# Patient Record
Sex: Male | Born: 1985 | Race: White | Hispanic: No | Marital: Single | Smoking: Former smoker
Health system: Southern US, Community
[De-identification: ages and names within clinical notes are randomized; demographics above are authoritative.]

## PROBLEM LIST (undated history)

## (undated) DIAGNOSIS — R569 Unspecified convulsions: Secondary | ICD-10-CM

## (undated) DIAGNOSIS — F419 Anxiety disorder, unspecified: Secondary | ICD-10-CM

## (undated) DIAGNOSIS — K219 Gastro-esophageal reflux disease without esophagitis: Secondary | ICD-10-CM

## (undated) DIAGNOSIS — F1910 Other psychoactive substance abuse, uncomplicated: Secondary | ICD-10-CM

## (undated) HISTORY — DX: Other psychoactive substance abuse, uncomplicated: F19.10

## (undated) HISTORY — DX: Anxiety disorder, unspecified: F41.9

## (undated) HISTORY — DX: Unspecified convulsions: R56.9

---

## 2007-12-07 ENCOUNTER — Emergency Department (HOSPITAL_COMMUNITY): Admission: EM | Admit: 2007-12-07 | Discharge: 2007-12-07 | Payer: Self-pay | Admitting: Emergency Medicine

## 2007-12-18 ENCOUNTER — Emergency Department: Payer: Self-pay | Admitting: Emergency Medicine

## 2008-05-26 ENCOUNTER — Emergency Department: Payer: Self-pay | Admitting: Emergency Medicine
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
## 2013-09-12 ENCOUNTER — Ambulatory Visit: Payer: Self-pay | Admitting: Family Medicine

## 2015-01-24 ENCOUNTER — Encounter: Payer: Self-pay | Admitting: Emergency Medicine

## 2015-01-24 ENCOUNTER — Emergency Department
Admission: EM | Admit: 2015-01-24 | Discharge: 2015-01-24 | Disposition: A | Discharge status: Home / Self Care | Payer: Self-pay | Attending: Emergency Medicine | Admitting: Emergency Medicine

## 2015-01-24 ENCOUNTER — Emergency Department: Payer: Self-pay

## 2015-01-24 DIAGNOSIS — Y9289 Other specified places as the place of occurrence of the external cause: Secondary | ICD-10-CM | POA: Insufficient documentation

## 2015-01-24 DIAGNOSIS — S61411A Laceration without foreign body of right hand, initial encounter: Secondary | ICD-10-CM | POA: Insufficient documentation

## 2015-01-24 DIAGNOSIS — Y998 Other external cause status: Secondary | ICD-10-CM | POA: Insufficient documentation

## 2015-01-24 DIAGNOSIS — Z23 Encounter for immunization: Secondary | ICD-10-CM | POA: Insufficient documentation

## 2015-01-24 DIAGNOSIS — S61431A Puncture wound without foreign body of right hand, initial encounter: Secondary | ICD-10-CM | POA: Insufficient documentation

## 2015-01-24 DIAGNOSIS — Z720 Tobacco use: Secondary | ICD-10-CM | POA: Insufficient documentation

## 2015-01-24 DIAGNOSIS — Y288XXA Contact with other sharp object, undetermined intent, initial encounter: Secondary | ICD-10-CM | POA: Insufficient documentation

## 2015-01-24 DIAGNOSIS — Y9389 Activity, other specified: Secondary | ICD-10-CM | POA: Insufficient documentation

## 2015-01-24 MED ORDER — CEPHALEXIN 500 MG PO CAPS: 500.0000 mg | ORAL_CAPSULE | Freq: Two times a day (BID) | ORAL | Status: AC

## 2015-01-24 MED ORDER — NAPROXEN 500 MG PO TABS: 500.0000 mg | ORAL_TABLET | Freq: Two times a day (BID) | ORAL | Status: AC

## 2015-01-24 MED ORDER — TETANUS-DIPHTH-ACELL PERTUSSIS 5-2.5-18.5 LF-MCG/0.5 IM SUSP
0.5000 mL | Freq: Once | INTRAMUSCULAR | Status: AC
  Administered 2015-01-24: 0.5 mL via INTRAMUSCULAR
  Filled 2015-01-24: qty 0.5
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
## 2015-01-24 NOTE — Discharge Instructions (Signed)
Return to the emergency department for symptoms that change or worsen after unable to schedule an appointment with primary care.

## 2015-04-20 ENCOUNTER — Encounter: Payer: Self-pay | Admitting: Emergency Medicine

## 2015-04-20 ENCOUNTER — Emergency Department
Admission: EM | Admit: 2015-04-20 | Discharge: 2015-04-20 | Disposition: A | Discharge status: Home / Self Care | Payer: Self-pay | Attending: Emergency Medicine | Admitting: Emergency Medicine

## 2015-04-20 DIAGNOSIS — Y9289 Other specified places as the place of occurrence of the external cause: Secondary | ICD-10-CM | POA: Insufficient documentation

## 2015-04-20 DIAGNOSIS — Z791 Long term (current) use of non-steroidal anti-inflammatories (NSAID): Secondary | ICD-10-CM | POA: Insufficient documentation

## 2015-04-20 DIAGNOSIS — Y9389 Activity, other specified: Secondary | ICD-10-CM | POA: Insufficient documentation

## 2015-04-20 DIAGNOSIS — Y998 Other external cause status: Secondary | ICD-10-CM | POA: Insufficient documentation

## 2015-04-20 DIAGNOSIS — Z720 Tobacco use: Secondary | ICD-10-CM | POA: Insufficient documentation

## 2015-04-20 DIAGNOSIS — W228XXA Striking against or struck by other objects, initial encounter: Secondary | ICD-10-CM | POA: Insufficient documentation

## 2015-04-20 DIAGNOSIS — S0502XA Injury of conjunctiva and corneal abrasion without foreign body, left eye, initial encounter: Secondary | ICD-10-CM | POA: Insufficient documentation

## 2015-04-20 MED ORDER — CIPROFLOXACIN HCL 0.3 % OP SOLN
2.0000 [drp] | OPHTHALMIC | Status: DC
  Administered 2015-04-20: 2 [drp] via OPHTHALMIC

## 2015-04-20 MED ORDER — TETRACAINE HCL 0.5 % OP SOLN
2.0000 [drp] | Freq: Once | OPHTHALMIC | Status: AC
  Administered 2015-04-20: 2 [drp] via OPHTHALMIC

## 2015-04-20 MED ORDER — FLUORESCEIN SODIUM 1 MG OP STRP
1.0000 | ORAL_STRIP | Freq: Once | OPHTHALMIC | Status: AC
  Administered 2015-04-20: 1 via OPHTHALMIC

## 2015-04-20 MED ORDER — CIPROFLOXACIN HCL 0.3 % OP SOLN
OPHTHALMIC | Status: AC
Start: 2015-04-20 — End: 2015-04-20
  Administered 2015-04-20: 2 [drp] via OPHTHALMIC
  Filled 2015-04-20: qty 2.5

## 2015-04-20 MED ORDER — TETRACAINE HCL 0.5 % OP SOLN
OPHTHALMIC | Status: AC
Start: 2015-04-20 — End: 2015-04-20
  Administered 2015-04-20: 2 [drp] via OPHTHALMIC
  Filled 2015-04-20: qty 2

## 2015-04-20 MED ORDER — CIPROFLOXACIN HCL 0.3 % OP SOLN: 2.0000 [drp] | OPHTHALMIC | Status: AC

## 2015-04-20 MED ORDER — KETOROLAC TROMETHAMINE 0.5 % OP SOLN: 1.0000 [drp] | Freq: Four times a day (QID) | OPHTHALMIC | Status: DC

## 2015-04-20 MED ORDER — FLUORESCEIN SODIUM 1 MG OP STRP
ORAL_STRIP | OPHTHALMIC | Status: AC
  Administered 2015-04-20: 1 via OPHTHALMIC
  Filled 2015-04-20: qty 1

## 2015-04-20 MED ORDER — ACETAMINOPHEN-CODEINE #3 300-30 MG PO TABS: 1.0000 | ORAL_TABLET | ORAL | Status: DC | PRN

## 2015-04-20 NOTE — Discharge Instructions (Signed)

## 2015-04-20 NOTE — ED Notes (Signed)
Discussed discharge instructions, prescriptions, and follow-up care with patient. No questions or concerns at this time. Pt stable at discharge.  
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
## 2016-08-29 ENCOUNTER — Encounter (HOSPITAL_COMMUNITY): Payer: Self-pay

## 2016-08-29 ENCOUNTER — Emergency Department (HOSPITAL_COMMUNITY)
Admission: EM | Admit: 2016-08-29 | Discharge: 2016-08-30 | Disposition: A | Discharge status: Home / Self Care | Payer: Self-pay | Attending: Emergency Medicine | Admitting: Emergency Medicine

## 2016-08-29 ENCOUNTER — Emergency Department (HOSPITAL_COMMUNITY): Payer: Self-pay

## 2016-08-29 DIAGNOSIS — F17200 Nicotine dependence, unspecified, uncomplicated: Secondary | ICD-10-CM | POA: Insufficient documentation

## 2016-08-29 DIAGNOSIS — R0789 Other chest pain: Secondary | ICD-10-CM | POA: Insufficient documentation

## 2016-08-29 LAB — BASIC METABOLIC PANEL
ANION GAP: 9 (ref 5–15)
BUN: 19 mg/dL (ref 6–20)
CALCIUM: 9 mg/dL (ref 8.9–10.3)
CO2: 27 mmol/L (ref 22–32)
Chloride: 99 mmol/L — ABNORMAL LOW (ref 101–111)
Creatinine, Ser: 0.96 mg/dL (ref 0.61–1.24)
Glucose, Bld: 74 mg/dL (ref 65–99)
Potassium: 3.2 mmol/L — ABNORMAL LOW (ref 3.5–5.1)
Sodium: 135 mmol/L (ref 135–145)

## 2016-08-29 LAB — CBC
HCT: 42.2 % (ref 39.0–52.0)
HEMOGLOBIN: 14.6 g/dL (ref 13.0–17.0)
MCH: 31.1 pg (ref 26.0–34.0)
MCHC: 34.6 g/dL (ref 30.0–36.0)
MCV: 89.8 fL (ref 78.0–100.0)
Platelets: 232 10*3/uL (ref 150–400)
RBC: 4.7 MIL/uL (ref 4.22–5.81)
RDW: 13.2 % (ref 11.5–15.5)
WBC: 8.3 10*3/uL (ref 4.0–10.5)

## 2016-08-29 LAB — I-STAT TROPONIN, ED: TROPONIN I, POC: 0 ng/mL (ref 0.00–0.08)

## 2016-08-29 NOTE — ED Notes (Signed)
Patient transported to X-ray 

## 2016-08-29 NOTE — ED Triage Notes (Signed)
Pt states that for the past several weeks he has been having CP on and off, L sided, with SOB, denies n/v, denies radiation.

## 2016-08-29 NOTE — ED Provider Notes (Signed)
[facility]-EMERGENCY DEPT Provider Note   CSN: [id number] Arrival date & time: 08/29/16  2126     History   Chief Complaint Chief Complaint  Patient presents with  . Chest Pain    HPI Mario [name] is a 31 y.o. male.  HPI   Patient is a 31 year old male with no pertinent past medical history presents the ED with complaint of intermittent chest pain, onset 2 weeks. Patient reports first noticing left-sided chest discomfort which he describes as "tightness and squeezing sensation to the left side of my chest" that first occurred while he was smoking a cigarette at home. He notes over the past 2 weeks he has had more frequent episodes of left-sided chest pain which she states typically occur when he is active, stressed, anxious or "amped up". Patient states the symptoms only last for a few minutes and typically improve when she sits down and rests. However he reports over the past week he has had a constant mild discomfort to the left side of his chest. Denies radiation. He reports his last episode of chest tightness began around 4 PM when he got off work and states has improved since arrival to the ED. Denies taking any medications at home for his symptoms. He also reports associated shortness of breath which she describes as "I just feel like I haven't been able to take a full deep breath for the past 2 weeks." Denies fever, headache, dizziness, and cough, wheezing, palpitations, abdominal pain, nausea, vomiting, neck/back pain, numbness, tingling, weakness, syncope. Patient denies personal history of cardiac disease. Patient denies his siblings or parents with history of cardiac disease. Patient endorses smoking marijuana but denies any other illicit drug use. Endorses smoking 1 pack per day but states he has been smoking less over the past week due to his symptoms. Denies leg swelling, hx of DVT/PE, recent hospitalization/surgery/immobilization, recent travel, hx of cancer or hormone use.    History reviewed. No pertinent past medical history.  There are no active problems to display for this patient.   History reviewed. No pertinent surgical history.     Home Medications    Prior to Admission medications   Medication Sig Start Date End Date Taking? Authorizing Provider  ibuprofen (ADVIL,MOTRIN) 200 MG tablet Take 800 mg by mouth every 6 (six) hours as needed for headache or mild pain.   Yes Historical Provider, MD  acetaminophen-codeine (TYLENOL #3) 300-30 MG tablet Take 1-2 tablets by mouth every 4 (four) hours as needed for moderate pain. Patient not taking: Reported on 08/29/2016 04/20/15   Chinita Pester, FNP  ketorolac (ACULAR) 0.5 % ophthalmic solution Place 1 drop into the left eye 4 (four) times daily. Patient not taking: Reported on 08/29/2016 04/20/15   Chinita Pester, FNP    Family History No family history on file.  Social History Social History  Substance Use Topics  . Smoking status: Current Some Day Smoker  . Smokeless tobacco: Never Used  . Alcohol use Yes     Allergies   Patient has no known allergies.   Review of Systems Review of Systems  Respiratory: Positive for shortness of breath.   Cardiovascular: Positive for chest pain.  All other systems reviewed and are negative.    Physical Exam Updated Vital Signs BP 96/60   Pulse (!) 51   Temp 98 F (36.7 C)   Resp 15   Ht 6\' 2"  (1.88 m)   Wt 108.9 kg   SpO2 96%   BMI 30.81  kg/m   Physical Exam  Constitutional: He is oriented to person, place, and time. He appears well-developed and well-nourished. No distress.  HENT:  Head: Normocephalic and atraumatic.  Mouth/Throat: Oropharynx is clear and moist. No oropharyngeal exudate.  Eyes: Conjunctivae and EOM are normal. Right eye exhibits no discharge. Left eye exhibits no discharge. No scleral icterus.  Neck: Normal range of motion. Neck supple.  Cardiovascular: Normal rate, regular rhythm, normal heart sounds and intact distal  pulses.   Pulses:      Radial pulses are 2+ on the right side, and 2+ on the left side.       Posterior tibial pulses are 2+ on the right side, and 2+ on the left side.  Pulmonary/Chest: Effort normal and breath sounds normal. No respiratory distress. He has no wheezes. He has no rales. He exhibits no tenderness.  Abdominal: Soft. Bowel sounds are normal. He exhibits no distension and no mass. There is no tenderness. There is no rebound and no guarding.  Musculoskeletal: Normal range of motion. He exhibits no edema.  Neurological: He is alert and oriented to person, place, and time.  Skin: Skin is warm and dry. He is not diaphoretic.  Nursing note and vitals reviewed.    ED Treatments / Results  Labs (all labs ordered are listed, but only abnormal results are displayed) Labs Reviewed  BASIC METABOLIC PANEL - Abnormal; Notable for the following:       Result Value   Potassium 3.2 (*)    Chloride 99 (*)    All other components within normal limits  CBC  I-STAT TROPOININ, ED  I-STAT TROPOININ, ED    EKG  EKG Interpretation  Date/Time:  Saturday August 29 2016 21:30:10 EDT Ventricular Rate:  68 PR Interval:  148 QRS Duration: 100 QT Interval:  360 QTC Calculation: 382 R Axis:   8 Text Interpretation:  Normal sinus rhythm with sinus arrhythmia Normal ECG No STEMI No old tracing to compare Confirmed by [facility] MD, PEDRO [phone]) on 08/29/2016 10:25:22 PM       Radiology Dg Chest 2 View  Result Date: 08/29/2016 CLINICAL DATA:  Left-sided chest pain on off for several weeks. Shortness of breath. EXAM: CHEST  2 VIEW COMPARISON:  None. FINDINGS: Hyperinflation suggesting emphysema. Central peribronchial thickening may indicate chronic bronchitis. No focal airspace disease or consolidation in the lungs. No blunting of costophrenic angles. No pneumothorax. Mediastinal contours appear intact. Normal heart size and pulmonary vascularity. IMPRESSION: Emphysematous and chronic bronchitic  changes are suggested. No evidence of active pulmonary disease. Electronically Signed   By: Burman Nieves M.D.   On: 08/29/2016 22:32    Procedures Procedures (including critical care time)  Medications Ordered in ED Medications - No data to display   Initial Impression / Assessment and Plan / ED Course  I have reviewed the triage vital signs and the nursing notes.  Pertinent labs & imaging results that were available during my care of the patient were reviewed by me and considered in my medical decision making (see chart for details).    Patient presents with intermittent episodes of left-sided chest pain for the past 2 weeks but reports having constant left-sided chest discomfort for the past week. Symptoms typically occur during activity or when is he "excited, anxious or amped up". Denies personal or family hx of CAD. Endorses smoking 1PPD. VSS. Exam unremarkable. EKG showed normal sinus rhythm. Troponin negative. K 3.2. Remaining labs unremarkable. CXR with no acute changes. HEART score 2. Discussed  pt with Dr. Eudelia Bunch. Due to pt with reported intermittent episodes of CP, will order delta trop for complete cardiac evaluation. Delta trop negative. I have a low suspicion for ACS, PE, dissection, or other acute cardiac event at this time. Discussed results and plan for d/c with pt. Plan to d/c pt home with PCP follow up. Discussed return precautions.  Final Clinical Impressions(s) / ED Diagnoses   Final diagnoses:  Other chest pain    New Prescriptions New Prescriptions   No medications on file     Barrett Henle, PA-C 08/30/16 0117    Nira Conn, MD 08/30/16 [phone]

## 2016-08-30 LAB — I-STAT TROPONIN, ED: Troponin i, poc: 0 ng/mL (ref 0.00–0.08)

## 2016-08-30 NOTE — Discharge Instructions (Signed)
I recommend taking 600mg  Ibuprofen every 6 hours as needed for pain relief.  I also recommend following up with the primary care office listed below in the next week for follow up evaluation regarding your chest pain.  Please return to the Emergency Department if symptoms worsen or new onset of fever, new/worsening chest pain, difficulty breathing, heart palpitations, coughing up blood, abdominal pain, vomiting, leg swelling, numbness, weakness, syncope.

## 2017-11-13 HISTORY — PX: OTHER SURGICAL HISTORY: SHX169

## 2018-05-02 ENCOUNTER — Encounter: Payer: Self-pay | Admitting: Emergency Medicine

## 2018-05-02 ENCOUNTER — Other Ambulatory Visit: Payer: Self-pay

## 2018-05-02 DIAGNOSIS — K8020 Calculus of gallbladder without cholecystitis without obstruction: Secondary | ICD-10-CM | POA: Insufficient documentation

## 2018-05-02 DIAGNOSIS — F17210 Nicotine dependence, cigarettes, uncomplicated: Secondary | ICD-10-CM | POA: Insufficient documentation

## 2018-05-02 LAB — CBC
HEMATOCRIT: 40.9 % (ref 39.0–52.0)
Hemoglobin: 13.7 g/dL (ref 13.0–17.0)
MCH: 30.1 pg (ref 26.0–34.0)
MCHC: 33.5 g/dL (ref 30.0–36.0)
MCV: 89.9 fL (ref 80.0–100.0)
NRBC: 0 % (ref 0.0–0.2)
PLATELETS: 270 10*3/uL (ref 150–400)
RBC: 4.55 MIL/uL (ref 4.22–5.81)
RDW: 12.7 % (ref 11.5–15.5)
WBC: 6.1 10*3/uL (ref 4.0–10.5)

## 2018-05-02 LAB — COMPREHENSIVE METABOLIC PANEL
ALBUMIN: 4.1 g/dL (ref 3.5–5.0)
ALK PHOS: 62 U/L (ref 38–126)
ALT: 18 U/L (ref 0–44)
ANION GAP: 6 (ref 5–15)
AST: 19 U/L (ref 15–41)
BUN: 18 mg/dL (ref 6–20)
CALCIUM: 9 mg/dL (ref 8.9–10.3)
CHLORIDE: 102 mmol/L (ref 98–111)
CO2: 31 mmol/L (ref 22–32)
Creatinine, Ser: 0.92 mg/dL (ref 0.61–1.24)
GFR calc Af Amer: >60 mL/min (ref >60)
GFR calc non Af Amer: >60 mL/min (ref >60)
GLUCOSE: 152 mg/dL — AB (ref 70–99)
Potassium: 3.8 mmol/L (ref 3.5–5.1)
SODIUM: 139 mmol/L (ref 135–145)
Total Bilirubin: 0.5 mg/dL (ref 0.3–1.2)
Total Protein: 6.9 g/dL (ref 6.5–8.1)

## 2018-05-02 LAB — LIPASE, BLOOD: LIPASE: 39 U/L (ref 11–51)

## 2018-05-02 NOTE — ED Triage Notes (Signed)
Pt reports that he has had bilat abd pain for the last month. States that he went to stand up at home and felt like he was going to pass out because of the pain. Denies any N/V/D.

## 2018-05-03 ENCOUNTER — Emergency Department: Payer: Self-pay

## 2018-05-03 ENCOUNTER — Emergency Department
Admission: EM | Admit: 2018-05-03 | Discharge: 2018-05-03 | Disposition: A | Discharge status: Home / Self Care | Payer: Self-pay | Attending: Emergency Medicine | Admitting: Emergency Medicine

## 2018-05-03 DIAGNOSIS — K8020 Calculus of gallbladder without cholecystitis without obstruction: Secondary | ICD-10-CM

## 2018-05-03 DIAGNOSIS — R1011 Right upper quadrant pain: Secondary | ICD-10-CM

## 2018-05-03 LAB — URINALYSIS, COMPLETE (UACMP) WITH MICROSCOPIC
Bacteria, UA: NONE SEEN
Bilirubin Urine: NEGATIVE
Glucose, UA: NEGATIVE mg/dL
HGB URINE DIPSTICK: NEGATIVE
Ketones, ur: NEGATIVE mg/dL
NITRITE: NEGATIVE
PH: 6 (ref 5.0–8.0)
Protein, ur: NEGATIVE mg/dL
Specific Gravity, Urine: 1.021 (ref 1.005–1.030)
Squamous Epithelial / LPF: NONE SEEN (ref 0–5)

## 2018-05-03 MED ORDER — ONDANSETRON 4 MG PO TBDP
ORAL_TABLET | ORAL | Status: AC
  Filled 2018-05-03: qty 1

## 2018-05-03 MED ORDER — ONDANSETRON 4 MG PO TBDP
4.0000 mg | ORAL_TABLET | Freq: Once | ORAL | Status: AC
  Administered 2018-05-03: 4 mg via ORAL

## 2018-05-03 NOTE — ED Notes (Signed)
Patient transported to Ultrasound at this time. 

## 2018-05-03 NOTE — ED Notes (Signed)
Pt returned to ED Rm 7 from US at this time. 

## 2018-05-03 NOTE — ED Provider Notes (Signed)
[facility] Emergency Department Provider Note   First MD Initiated Contact with Patient 05/03/18 [phone]     (approximate)  I have reviewed the triage vital signs and the nursing notes.   HISTORY  Chief Complaint Abdominal Pain    HPI Mario [name] is a 32 y.o. male presents to the emergency department with a 1 month history of intermittent upper abdominal discomfort with associated nausea.  Patient states that he does notice a relationship with the discomfort and eating.  Patient also admits to bloating.  Patient denies any personal history of cholelithiasis.  Patient denies any urinary symptoms no diarrhea.  Patient denies any fever.  Current pain score 4 out of 10.  Past medical history None There are no active problems to display for this patient.   Past surgical history None  Prior to Admission medications   Medication Sig Start Date End Date Taking? Authorizing Provider  acetaminophen-codeine (TYLENOL #3) 300-30 MG tablet Take 1-2 tablets by mouth every 4 (four) hours as needed for moderate pain. Patient not taking: Reported on 08/29/2016 04/20/15   Kem Boroughs B, FNP  ibuprofen (ADVIL,MOTRIN) 200 MG tablet Take 800 mg by mouth every 6 (six) hours as needed for headache or mild pain.    [provider]  ketorolac (ACULAR) 0.5 % ophthalmic solution Place 1 drop into the left eye 4 (four) times daily. Patient not taking: Reported on 08/29/2016 04/20/15   Kem Boroughs B, FNP    Allergies No known drug allergies History reviewed. No pertinent family history.  Social History Social History   Tobacco Use  . Smoking status: Current Some Day Smoker  . Smokeless tobacco: Never Used  Substance Use Topics  . Alcohol use: Yes  . Drug use: Yes    Types: Marijuana    Review of Systems Constitutional: No fever/chills Eyes: No visual changes. ENT: No sore throat. Cardiovascular: Denies chest pain. Respiratory: Denies shortness of  breath. Gastrointestinal: Positive for abdominal pain.  No nausea, no vomiting.  No diarrhea.  No constipation. Genitourinary: Negative for dysuria. Musculoskeletal: Negative for neck pain.  Negative for back pain. Integumentary: Negative for rash. Neurological: Negative for headaches, focal weakness or numbness.   ____________________________________________   PHYSICAL EXAM:  VITAL SIGNS: ED Triage Vitals [05/02/18 2248]  Enc Vitals Group     BP 124/86     Pulse Rate 87     Resp 20     Temp (!) 97.5 F (36.4 C)     Temp Source Oral     SpO2 100 %     Weight 106.6 kg (235 lb)     Height 1.88 m (6\' 2" )     Head Circumference      Peak Flow      Pain Score 4     Pain Loc      Pain Edu?      Excl. in GC?     Constitutional: Alert and oriented. Well appearing and in no acute distress. Eyes: Conjunctivae are normal.  Mouth/Throat: Mucous membranes are moist.  Oropharynx non-erythematous. Neck: No stridor.   Cardiovascular: Normal rate, regular rhythm. Good peripheral circulation. Grossly normal heart sounds. Respiratory: Normal respiratory effort.  No retractions. Lungs CTAB. Gastrointestinal: Right upper quadrant tenderness. No distention.   Musculoskeletal: No lower extremity tenderness nor edema. No gross deformities of extremities. Neurologic:  Normal speech and language. No gross focal neurologic deficits are appreciated.  Skin:  Skin is warm, dry and intact. No rash noted.  Psychiatric: Mood and affect are normal. Speech and behavior are normal.  ____________________________________________   LABS (all labs ordered are listed, but only abnormal results are displayed)  Labs Reviewed  COMPREHENSIVE METABOLIC PANEL - Abnormal; Notable for the following components:      Result Value   Glucose, Bld 152 (*)    All other components within normal limits  URINALYSIS, COMPLETE (UACMP) WITH MICROSCOPIC - Abnormal; Notable for the following components:   Color, Urine  YELLOW (*)    APPearance CLEAR (*)    Leukocytes, UA TRACE (*)    All other components within normal limits  LIPASE, BLOOD  CBC   ______________  RADIOLOGY I, [place] N , personally viewed and evaluated these images (plain radiographs) as part of my medical decision making, as well as reviewing the written report by the radiologist.  ED MD interpretation: Cholelithiasis with multiple gallstones without evidence of cholecystitis.   Official radiology report(s): US Abdomen Limited Ruq  Result Date: 05/03/2018 CLINICAL DATA:  Right upper quadrant pain. Normal liver function studies. EXAM: ULTRASOUND ABDOMEN LIMITED RIGHT UPPER QUADRANT COMPARISON:  None. FINDINGS: Gallbladder: Multiple stones are demonstrated throughout the gallbladder resulting in wall echo shadow complex. Stones measure up to 9 mm. A stone is demonstrated in the gallbladder neck. No gallbladder wall thickening or edema. Murphy's sign is negative. Common bile duct: Diameter: 2.4 mm, normal Liver: No focal lesion identified. Within normal limits in parenchymal echogenicity. Portal vein is patent on color Doppler imaging with normal direction of blood flow towards the liver. IMPRESSION: Cholelithiasis with multiple stones in the gallbladder. No additional changes to suggest acute cholecystitis. Electronically Signed   By: Burman Nieves M.D.   On: 05/03/2018 02:35      Procedures   ____________________________________________   INITIAL IMPRESSION / ASSESSMENT AND PLAN / ED COURSE  As part of my medical decision making, I reviewed the following data within the electronic MEDICAL RECORD NUMBER  27-year-old male presented with above-stated history and physical exam concerning for possible cholelithiasis and as such ultrasound was performed which confirmed the evidence of cholelithiasis without evidence of cholecystitis.  Patient will be referred to general surgery for further outpatient evaluation and  management ____________________________________________  FINAL CLINICAL IMPRESSION(S) / ED DIAGNOSES  Final diagnoses:  RUQ pain  Gallstones     MEDICATIONS GIVEN DURING THIS VISIT:  Medications  ondansetron (ZOFRAN-ODT) disintegrating tablet 4 mg (4 mg Oral Given 05/03/18 0109)     ED Discharge Orders    None       Note:  This document was prepared using Dragon voice recognition software and may include unintentional dictation errors.    Darci Current, MD 05/03/18 [phone]

## 2018-05-04 ENCOUNTER — Ambulatory Visit (INDEPENDENT_AMBULATORY_CARE_PROVIDER_SITE_OTHER): Payer: Self-pay | Admitting: General Surgery

## 2018-05-04 ENCOUNTER — Encounter: Payer: Self-pay | Admitting: General Surgery

## 2018-05-04 ENCOUNTER — Other Ambulatory Visit: Payer: Self-pay

## 2018-05-04 VITALS — BP 106/68 | HR 79 | Temp 97.9°F | Resp 18 | Wt 233.6 lb

## 2018-05-04 DIAGNOSIS — K8020 Calculus of gallbladder without cholecystitis without obstruction: Secondary | ICD-10-CM

## 2018-05-04 NOTE — Patient Instructions (Signed)
Patient will need to be scheduled for surgery with Dr.Cannon.   Call the office with any questions or concerns.   Cholecystostomy Cholecystostomy is a procedure to drain fluid from the gallbladder by using a flexible tube (catheter). The gallbladder is a pear-shaped organ that lies beneath the liver on the right side of the body. The gallbladder stores bile, which is a fluid that helps the body to digest fats. You may have this procedure:  If your gallbladder is swollen or irritated due to bile build up.  To prepare you for gallbladder surgery.  To control your symptoms if you cannot have gallbladder surgery.  Tell a health care provider about:  Any allergies you have.  All medicines you are taking, including vitamins, herbs, eye drops, creams, and over-the-counter medicines.  Any problems you or family members have had with anesthetic medicines.  Any blood disorders you have.  Any surgeries you have had.  Any medical conditions you have.  Whether you are pregnant or may be pregnant. What are the risks? Generally, this is a safe procedure. However, problems may occur, including:  The catheter moving out of place.  Clogging of the catheter.  Infection of the incision site.  Internal bleeding.  Puncture of the gallbladder. This can cause the bile to leak.  Infection inside the abdomen (peritonitis).  Damage to other structures or organs.  Low blood pressure and slowed heart rate.  Allergic reactions to medicines or dyes.  What happens before the procedure?  You may need to have tests, including: ? Imaging studies of your gallbladder. ? Blood tests.  Follow instructions from your health care provider about eating or drinking restrictions.  Do not use tobacco products, including cigarettes, chewing tobacco, or e-cigarettes, as told by your health care provider. If you need help quitting, ask your health care provider.  Ask your health care provider  about: ? Changing or stopping your regular medicines. This is especially important if you are taking diabetes medicines or blood thinners. ? Taking medicines such as aspirin and ibuprofen. These medicines can thin your blood. Do not take these medicines before your procedure if your health care provider instructs you not to.  Plan to have someone take you home after the procedure.  If you go home right after the procedure, plan to have someone with you for 24 hours.  Ask your health care provider how your surgical site will be marked or identified.  You may be given antibiotic medicine to help prevent infection. What happens during the procedure?  To reduce your risk of infection: ? Your health care team will wash or sanitize their hands. ? Your skin will be washed with soap.  An IV tube will be inserted into one of your veins.  You will be given one or more of the following: ? A medicine to help you relax (sedative). ? A medicine to numb the area (local anesthetic).  A small incision will be made in your abdomen.  A long needle or a wide puncturing tool (trocar) will be put through the incision.  Your health care provider will use an imaging study (ultrasound) to guide the needle or trocar into your gallbladder.  After the needle or trocar is in your gallbladder, a small amount of dye may be injected. An X-ray may be taken to make sure that the needle or trocar is in the correct place.  A catheter will be placed through the needle or trocar.  The catheter will be secured to  your skin with stitches (sutures).  The catheter will be connected to a drainage bag. Fluid will drain from the gallbladder into the bag. Some of this bile may be sent to the lab to be examined.  A bandage (dressing) will be placed over the incision. The procedure may vary among health care providers and hospitals. What happens after the procedure?  Your blood pressure, heart rate, breathing rate, and  blood oxygen level will be monitored often until the medicines you were given have worn off.  Dye may be injected through your catheter to check the catheter and your gallbladder.  Your gallbladder may be flushed out (irrigated) through the catheter.  Your catheter and drainage bag may need to stay in place for several weeks or as told by your health care provider. This information is not intended to replace advice given to you by your health care provider. Make sure you discuss any questions you have with your health care provider. Document Released: 08/28/2008 Document Revised: 11/07/2015 Document Reviewed: 09/12/2014 Elsevier Interactive Patient Education  2018 Elsevier Inc.    Low-Fat Diet for Pancreatitis or Gallbladder Conditions A low-fat diet can be helpful if you have pancreatitis or a gallbladder condition. With these conditions, your pancreas and gallbladder have trouble digesting fats. A healthy eating plan with less fat will help rest your pancreas and gallbladder and reduce your symptoms. What do I need to know about this diet?  Eat a low-fat diet. ? Reduce your fat intake to less than 20-30% of your total daily calories. This is less than 50-60 g of fat per day. ? Remember that you need some fat in your diet. Ask your dietician what your daily goal should be. ? Choose nonfat and low-fat healthy foods. Look for the words "nonfat," "low fat," or "fat free." ? As a guide, look on the label and choose foods with less than 3 g of fat per serving. Eat only one serving.  Avoid alcohol.  Do not smoke. If you need help quitting, talk with your health care provider.  Eat small frequent meals instead of three large heavy meals. What foods can I eat? Grains Include healthy grains and starches such as potatoes, wheat bread, fiber-rich cereal, and brown rice. Choose whole grain options whenever possible. In adults, whole grains should account for 45-65% of your daily calories. Fruits  and Vegetables Eat plenty of fruits and vegetables. Fresh fruits and vegetables add fiber to your diet. Meats and Other Protein Sources Eat lean meat such as chicken and pork. Trim any fat off of meat before cooking it. Eggs, fish, and beans are other sources of protein. In adults, these foods should account for 10-35% of your daily calories. Dairy Choose low-fat milk and dairy options. Dairy includes fat and protein, as well as calcium. Fats and Oils Limit high-fat foods such as fried foods, sweets, baked goods, sugary drinks. Other Creamy sauces and condiments, such as mayonnaise, can add extra fat. Think about whether or not you need to use them, or use smaller amounts or low fat options. What foods are not recommended?  High fat foods, such as: ? [employer]. ? Ice cream. ? [place] toast. ? Sweet rolls. ? Pizza. ? Cheese bread. ? Foods covered with batter, butter, creamy sauces, or cheese. ? Fried foods. ? Sugary drinks and desserts.  Foods that cause gas or bloating This information is not intended to replace advice given to you by your health care provider. Make sure you discuss any questions  you have with your health care provider. Document Released: 06/06/2013 Document Revised: 11/07/2015 Document Reviewed: 05/15/2013 Elsevier Interactive Patient Education  2017 Elsevier Inc.    Cholelithiasis Cholelithiasis is also called "gallstones." It is a kind of gallbladder disease. The gallbladder is an organ that stores a liquid (bile) that helps you digest fat. Gallstones may not cause symptoms (may be silent gallstones) until they cause a blockage, and then they can cause pain (gallbladder attack). Follow these instructions at home:  Take over-the-counter and prescription medicines only as told by your doctor.  Stay at a healthy weight.  Eat healthy foods. This includes: ? Eating fewer fatty foods, like fried foods. ? Eating fewer refined carbs (refined carbohydrates).  Refined carbs are breads and grains that are highly processed, like white bread and white rice. Instead, choose whole grains like whole-wheat bread and brown rice. ? Eating more fiber. Almonds, fresh fruit, and beans are healthy sources of fiber.  Keep all follow-up visits as told by your doctor. This is important. Contact a doctor if:  You have sudden pain in the upper right side of your belly (abdomen). Pain might spread to your right shoulder or your chest. This may be a sign of a gallbladder attack.  You feel sick to your stomach (are nauseous).  You throw up (vomit).  You have been diagnosed with gallstones that have no symptoms and you get: ? Belly pain. ? Discomfort, burning, or fullness in the upper part of your belly (indigestion). Get help right away if:  You have sudden pain in the upper right side of your belly, and it lasts for more than 2 hours.  You have belly pain that lasts for more than 5 hours.  You have a fever or chills.  You keep feeling sick to your stomach or you keep throwing up.  Your skin or the whites of your eyes turn yellow (jaundice).  You have dark-colored pee (urine).  You have light-colored poop (stool). Summary  Cholelithiasis is also called "gallstones."  The gallbladder is an organ that stores a liquid (bile) that helps you digest fat.  Silent gallstones are gallstones that do not cause symptoms.  A gallbladder attack may cause sudden pain in the upper right side of your belly. Pain might spread to your right shoulder or your chest. If this happens, contact your doctor.  If you have sudden pain in the upper right side of your belly that lasts for more than 2 hours, get help right away. This information is not intended to replace advice given to you by your health care provider. Make sure you discuss any questions you have with your health care provider. Document Released: 11/18/2007 Document Revised: 02/16/2016 Document Reviewed:  02/16/2016 Elsevier Interactive Patient Education  2017 [employer].

## 2018-05-04 NOTE — H&P (View-Only) (Signed)
Patient ID: Mario [name], male   DOB: 11/01/1985, 32 y.o.   MRN: [id number]  Chief Complaint  Patient presents with  . New Patient (Initial Visit)    new patient-ED referral-Cholelithiasis with multiple stones    HPI Mario [name] is a 32 y.o. male.   He was seen in the emergency department approximately 36 hours ago for right upper quadrant pain.  He states that prior to presentation the pain had been present for about 3 weeks.  At worst the pain is about a 4 however the night of his presentation it became worse prompting his arrival to the emergency department.  The specific episode did involve some nausea but no emesis.  He states that eating seems to make his pain worse but not specifically fatty or fried foods.  He does endorse some bloating.  He denies ever having become jaundiced or passing dark urine or clay colored stools.  He reports that his grandfather, aunt, and sister all had cholecystectomies for gallstones.  A right upper quadrant ultrasound performed in the emergency department identified multiple gallstones with one being found within the gallbladder neck.  There was no pericholecystic fluid, gallbladder wall thickening, or biliary dilatation.  He is here today to discuss further options.   Past Medical History:  Diagnosis Date  . Anxiety   . Seizures (HCC)    32 years old  . Substance abuse (HCC)    as a teenager    History reviewed. No pertinent surgical history.  Family History  Problem Relation Age of Onset  . Cancer Maternal Aunt   . Cancer Maternal Grandmother     Social History Social History   Tobacco Use  . Smoking status: Former Smoker  . Smokeless tobacco: Never Used  . Tobacco comment: quit 1 year ago  Substance Use Topics  . Alcohol use: Yes  . Drug use: Yes    Types: Marijuana    Comment: tried other drugs as a teenager    No Known Allergies  Current Outpatient Medications  Medication Sig Dispense Refill  . ibuprofen (ADVIL,MOTRIN)  200 MG tablet Take 800 mg by mouth every 6 (six) hours as needed for headache or mild pain.     No current facility-administered medications for this visit.     Review of Systems Review of Systems  All other systems reviewed and are negative. or as per the HPI  Blood pressure 106/68, pulse 79, temperature 97.9 F (36.6 C), temperature source Temporal, resp. rate 18, weight 233 lb 9.6 oz (106 kg), SpO2 97 %.  Physical Exam Physical Exam  Constitutional: He is oriented to person, place, and time. He appears well-developed and well-nourished. No distress.  HENT:  Head: Normocephalic and atraumatic.  Mouth/Throat: Oropharynx is clear and moist.  Eyes: Pupils are equal, round, and reactive to light. No scleral icterus.  Neck: Normal range of motion. Neck supple. No tracheal deviation present. No thyromegaly present.  Cardiovascular: Normal rate, regular rhythm and intact distal pulses.  Pulmonary/Chest: Effort normal and breath sounds normal.  Abdominal: Soft. Bowel sounds are normal. There is tenderness. There is no rebound and no guarding.  Minimal tenderness to deep palpation at the RUQ. No Murphy's sign.  Musculoskeletal: He exhibits no edema, tenderness or deformity.  Lymphadenopathy:    He has no cervical adenopathy.  Neurological: He is alert and oriented to person, place, and time.  Skin: Skin is warm and dry.  Psychiatric: He has a normal mood and affect.      Data Reviewed Results for Mario [name] (MRN [id number]) as of 05/04/2018 15:04  Ref. Range 05/02/2018 22:51 05/03/2018 00:44 05/03/2018 02:26  Potassium Latest Ref Range: 3.5 - 5.1 mmol/L 3.8    Chloride Latest Ref Range: 98 - 111 mmol/L 102    CO2 Latest Ref Range: 22 - 32 mmol/L 31    Glucose Latest Ref Range: 70 - 99 mg/dL 152 (H)    BUN Latest Ref Range: 6 - 20 mg/dL 18    Creatinine Latest Ref Range: 0.61 - 1.24 mg/dL 0.92    Calcium Latest Ref Range: 8.9 - 10.3 mg/dL 9.0    Anion gap Latest Ref Range: 5 -  15  6    Alkaline Phosphatase Latest Ref Range: 38 - 126 U/L 62    Albumin Latest Ref Range: 3.5 - 5.0 g/dL 4.1    Lipase Latest Ref Range: 11 - 51 U/L 39    AST Latest Ref Range: 15 - 41 U/L 19    ALT Latest Ref Range: 0 - 44 U/L 18    Total Protein Latest Ref Range: 6.5 - 8.1 g/dL 6.9    Total Bilirubin Latest Ref Range: 0.3 - 1.2 mg/dL 0.5    GFR, Est Non African American Latest Ref Range: >60 mL/min >60    GFR, Est African American Latest Ref Range: >60 mL/min >60    WBC Latest Ref Range: 4.0 - 10.5 K/uL 6.1    RBC Latest Ref Range: 4.22 - 5.81 MIL/uL 4.55    Hemoglobin Latest Ref Range: 13.0 - 17.0 g/dL 13.7    HCT Latest Ref Range: 39.0 - 52.0 % 40.9    MCV Latest Ref Range: 80.0 - 100.0 fL 89.9    MCH Latest Ref Range: 26.0 - 34.0 pg 30.1    MCHC Latest Ref Range: 30.0 - 36.0 g/dL 33.5    RDW Latest Ref Range: 11.5 - 15.5 % 12.7    Platelets Latest Ref Range: 150 - 400 K/uL 270    nRBC Latest Ref Range: 0.0 - 0.2 % 0.0    Appearance Latest Ref Range: CLEAR   CLEAR (A)   Bilirubin Urine Latest Ref Range: NEGATIVE   NEGATIVE   Color, Urine Latest Ref Range: YELLOW   YELLOW (A)   Glucose, UA Latest Ref Range: NEGATIVE mg/dL  NEGATIVE   Hgb urine dipstick Latest Ref Range: NEGATIVE   NEGATIVE   Ketones, ur Latest Ref Range: NEGATIVE mg/dL  NEGATIVE   Leukocytes, UA Latest Ref Range: NEGATIVE   TRACE (A)   Nitrite Latest Ref Range: NEGATIVE   NEGATIVE   pH Latest Ref Range: 5.0 - 8.0   6.0   Protein Latest Ref Range: NEGATIVE mg/dL  NEGATIVE   Specific Gravity, Urine Latest Ref Range: 1.005 - 1.030   1.021     CLINICAL DATA:  Right upper quadrant pain. Normal liver function studies.  EXAM: ULTRASOUND ABDOMEN LIMITED RIGHT UPPER QUADRANT  COMPARISON:  None.  FINDINGS: Gallbladder:  Multiple stones are demonstrated throughout the gallbladder resulting in wall echo shadow complex. Stones measure up to 9 mm. A stone is demonstrated in the gallbladder neck. No  gallbladder wall thickening or edema. Murphy's sign is negative.  Common bile duct:  Diameter: 2.4 mm, normal  Liver:  No focal lesion identified. Within normal limits in parenchymal echogenicity. Portal vein is patent on color Doppler imaging with normal direction of blood flow towards the liver.  IMPRESSION: Cholelithiasis with multiple stones in the gallbladder. No additional changes   to suggest acute cholecystitis.   Electronically Signed   By: William  Stevens M.[name].   On: 05/03/2018 02:35  Assessment    32 y/o M with symptomatic gallstones, normal LFTs, no history of obstruction or cholecystitis.     Plan: We discussed that gallstones are common and often asymptomatic, however the presence of the stone in the neck of his gallbladder is likely responsible for his symptoms. While many people never have another attack of biliary colic, he has had persistent discomfort for roughly 3 weeks and would like to have his gallbladder removed.  I discussed the procedure in detail.  We discussed the risks and benefits of a laparoscopic cholecystectomy and possible cholangiogram including, but not limited to: bleeding, infection, injury to surrounding structures such as the intestine or liver, bile leak, retained gallstones, need to convert to an open procedure, prolonged diarrhea, blood clots such as DVT, common bile duct injury, anesthesia risks, and possible need for additional procedures. The patient had the opportunity to ask any questions and these were answered to his satisfaction.    Will schedule for surgery.       Chrystian Ressler 05/04/2018, 3:00 PM   

## 2018-05-04 NOTE — Progress Notes (Signed)
Patient ID: Mario [name], male   DOB: [birth year], 32 y.o.   MRN: [id number]  Chief Complaint  Patient presents with  . New Patient (Initial Visit)    new patient-ED referral-Cholelithiasis with multiple stones    HPI Mario [name] is a 32 y.o. male.   He was seen in the emergency department approximately 36 hours ago for right upper quadrant pain.  He states that prior to presentation the pain had been present for about 3 weeks.  At worst the pain is about a 4 however the night of his presentation it became worse prompting his arrival to the emergency department.  The specific episode did involve some nausea but no emesis.  He states that eating seems to make his pain worse but not specifically fatty or fried foods.  He does endorse some bloating.  He denies ever having become jaundiced or passing dark urine or clay colored stools.  He reports that his grandfather, aunt, and sister all had cholecystectomies for gallstones.  A right upper quadrant ultrasound performed in the emergency department identified multiple gallstones with one being found within the gallbladder neck.  There was no pericholecystic fluid, gallbladder wall thickening, or biliary dilatation.  He is here today to discuss further options.   Past Medical History:  Diagnosis Date  . Anxiety   . Seizures (HCC)    32 years old  . Substance abuse (HCC)    as a teenager    History reviewed. No pertinent surgical history.  Family History  Problem Relation Age of Onset  . Cancer Maternal Aunt   . Cancer Maternal Grandmother     Social History Social History   Tobacco Use  . Smoking status: Former Games developer  . Smokeless tobacco: Never Used  . Tobacco comment: quit 1 year ago  Substance Use Topics  . Alcohol use: Yes  . Drug use: Yes    Types: Marijuana    Comment: tried other drugs as a teenager    No Known Allergies  Current Outpatient Medications  Medication Sig Dispense Refill  . ibuprofen (ADVIL,MOTRIN)  200 MG tablet Take 800 mg by mouth every 6 (six) hours as needed for headache or mild pain.     No current facility-administered medications for this visit.     Review of Systems Review of Systems  All other systems reviewed and are negative. or as per the HPI  Blood pressure 106/68, pulse 79, temperature 97.9 F (36.6 C), temperature source Temporal, resp. rate 18, weight 233 lb 9.6 oz (106 kg), SpO2 97 %.  Physical Exam Physical Exam  Constitutional: He is oriented to person, place, and time. He appears well-developed and well-nourished. No distress.  HENT:  Head: Normocephalic and atraumatic.  Mouth/Throat: Oropharynx is clear and moist.  Eyes: Pupils are equal, round, and reactive to light. No scleral icterus.  Neck: Normal range of motion. Neck supple. No tracheal deviation present. No thyromegaly present.  Cardiovascular: Normal rate, regular rhythm and intact distal pulses.  Pulmonary/Chest: Effort normal and breath sounds normal.  Abdominal: Soft. Bowel sounds are normal. There is tenderness. There is no rebound and no guarding.  Minimal tenderness to deep palpation at the RUQ. No Murphy's sign.  Musculoskeletal: He exhibits no edema, tenderness or deformity.  Lymphadenopathy:    He has no cervical adenopathy.  Neurological: He is alert and oriented to person, place, and time.  Skin: Skin is warm and dry.  Psychiatric: He has a normal mood and affect.  Data Reviewed Results for Mario [name] (MRN [id number]) as of 05/04/2018 15:04  Ref. Range 05/02/2018 22:51 05/03/2018 00:44 05/03/2018 02:26  Potassium Latest Ref Range: 3.5 - 5.1 mmol/L 3.8    Chloride Latest Ref Range: 98 - 111 mmol/L 102    CO2 Latest Ref Range: 22 - 32 mmol/L 31    Glucose Latest Ref Range: 70 - 99 mg/dL 102 (H)    BUN Latest Ref Range: 6 - 20 mg/dL 18    Creatinine Latest Ref Range: 0.61 - 1.24 mg/dL 7.25    Calcium Latest Ref Range: 8.9 - 10.3 mg/dL 9.0    Anion gap Latest Ref Range: 5 -  15  6    Alkaline Phosphatase Latest Ref Range: 38 - 126 U/L 62    Albumin Latest Ref Range: 3.5 - 5.0 g/dL 4.1    Lipase Latest Ref Range: 11 - 51 U/L 39    AST Latest Ref Range: 15 - 41 U/L 19    ALT Latest Ref Range: 0 - 44 U/L 18    Total Protein Latest Ref Range: 6.5 - 8.1 g/dL 6.9    Total Bilirubin Latest Ref Range: 0.3 - 1.2 mg/dL 0.5    GFR, Est Non African American Latest Ref Range: >60 mL/min >60    GFR, Est African American Latest Ref Range: >60 mL/min >60    WBC Latest Ref Range: 4.0 - 10.5 K/uL 6.1    RBC Latest Ref Range: 4.22 - 5.81 MIL/uL 4.55    Hemoglobin Latest Ref Range: 13.0 - 17.0 g/dL 36.6    HCT Latest Ref Range: 39.0 - 52.0 % 40.9    MCV Latest Ref Range: 80.0 - 100.0 fL 89.9    MCH Latest Ref Range: 26.0 - 34.0 pg 30.1    MCHC Latest Ref Range: 30.0 - 36.0 g/dL 44.0    RDW Latest Ref Range: 11.5 - 15.5 % 12.7    Platelets Latest Ref Range: 150 - 400 K/uL 270    nRBC Latest Ref Range: 0.0 - 0.2 % 0.0    Appearance Latest Ref Range: CLEAR   CLEAR (A)   Bilirubin Urine Latest Ref Range: NEGATIVE   NEGATIVE   Color, Urine Latest Ref Range: YELLOW   YELLOW (A)   Glucose, UA Latest Ref Range: NEGATIVE mg/dL  NEGATIVE   Hgb urine dipstick Latest Ref Range: NEGATIVE   NEGATIVE   Ketones, ur Latest Ref Range: NEGATIVE mg/dL  NEGATIVE   Leukocytes, UA Latest Ref Range: NEGATIVE   TRACE (A)   Nitrite Latest Ref Range: NEGATIVE   NEGATIVE   pH Latest Ref Range: 5.0 - 8.0   6.0   Protein Latest Ref Range: NEGATIVE mg/dL  NEGATIVE   Specific Gravity, Urine Latest Ref Range: 1.005 - 1.030   1.021     CLINICAL DATA:  Right upper quadrant pain. Normal liver function studies.  EXAM: ULTRASOUND ABDOMEN LIMITED RIGHT UPPER QUADRANT  COMPARISON:  None.  FINDINGS: Gallbladder:  Multiple stones are demonstrated throughout the gallbladder resulting in wall echo shadow complex. Stones measure up to 9 mm. A stone is demonstrated in the gallbladder neck. No  gallbladder wall thickening or edema. Murphy's sign is negative.  Common bile duct:  Diameter: 2.4 mm, normal  Liver:  No focal lesion identified. Within normal limits in parenchymal echogenicity. Portal vein is patent on color Doppler imaging with normal direction of blood flow towards the liver.  IMPRESSION: Cholelithiasis with multiple stones in the gallbladder. No additional changes  to suggest acute cholecystitis.   Electronically Signed   By: Burman Nieves M.D.   On: 05/03/2018 02:35  Assessment    32 y/o M with symptomatic gallstones, normal LFTs, no history of obstruction or cholecystitis.     Plan: We discussed that gallstones are common and often asymptomatic, however the presence of the stone in the neck of his gallbladder is likely responsible for his symptoms. While many people never have another attack of biliary colic, he has had persistent discomfort for roughly 3 weeks and would like to have his gallbladder removed.  I discussed the procedure in detail.  We discussed the risks and benefits of a laparoscopic cholecystectomy and possible cholangiogram including, but not limited to: bleeding, infection, injury to surrounding structures such as the intestine or liver, bile leak, retained gallstones, need to convert to an open procedure, prolonged diarrhea, blood clots such as DVT, common bile duct injury, anesthesia risks, and possible need for additional procedures. The patient had the opportunity to ask any questions and these were answered to his satisfaction.    Will schedule for surgery.       Duanne Guess 05/04/2018, 3:00 PM

## 2018-05-09 ENCOUNTER — Telehealth: Payer: Self-pay | Admitting: *Deleted

## 2018-05-09 ENCOUNTER — Encounter: Payer: Self-pay | Admitting: *Deleted

## 2018-05-09 NOTE — Telephone Encounter (Signed)
Patient's surgery has been scheduled for 05-17-18 at [facility] with Dr. Lady Gary.   The patient is aware to be expecting a phone call from the Pre-Admission Testing Department to complete a phone interview.   The patient is self pay and has been given an estimate of surgery charges for Dr. Lady Gary only. Patient reports he will call 05-10-18 or 05-11-18 to make a pre-surgery payment over the phone.   The patient is aware to call the office should they have further questions.

## 2018-05-10 ENCOUNTER — Encounter
Admission: RE | Admit: 2018-05-10 | Discharge: 2018-05-10 | Disposition: A | Discharge status: Home / Self Care | Payer: Self-pay | Source: Ambulatory Visit | Attending: General Surgery | Admitting: General Surgery

## 2018-05-10 ENCOUNTER — Other Ambulatory Visit: Payer: Self-pay

## 2018-05-10 HISTORY — DX: Gastro-esophageal reflux disease without esophagitis: K21.9

## 2018-05-10 NOTE — Patient Instructions (Signed)
Your procedure is scheduled on: 05-17-18 Report to Same Day Surgery 2nd floor medical mall [facility] Entrance-take elevator on left to 2nd floor.  Check in with surgery information desk.) To find out your arrival time please call [phone] between 1PM - 3PM on 05-16-18  Remember: Instructions that are not followed completely may result in serious medical risk, up to and including death, or upon the discretion of your surgeon and anesthesiologist your surgery may need to be rescheduled.    _x___ 1. Do not eat food after midnight the night before your procedure. You may drink clear liquids up to 2 hours before you are scheduled to arrive at the hospital for your procedure.  Do not drink clear liquids within 2 hours of your scheduled arrival to the hospital.  Clear liquids include  --Water or Apple juice without pulp  --Clear carbohydrate beverage such as ClearFast or Gatorade  --Black Coffee or Clear Tea (No milk, no creamers, do not add anything to the coffee or Tea   ____Ensure clear carbohydrate drink on the way to the hospital for bariatric patients  ____Ensure clear carbohydrate drink 3 hours before surgery for Dr Rutherford Nail patients if physician instructed.   No gum chewing or hard candies.     __x__ 2. No Alcohol for 24 hours before or after surgery.   __x__3. No Smoking or e-cigarettes for 24 prior to surgery.  Do not use any chewable tobacco products for at least 6 hour prior to surgery   ____  4. Bring all medications with you on the day of surgery if instructed.    __x__ 5. Notify your doctor if there is any change in your medical condition     (cold, fever, infections).    x___6. On the morning of surgery brush your teeth with toothpaste and water.  You may rinse your mouth with mouth wash if you wish.  Do not swallow any toothpaste or mouthwash.   Do not wear jewelry, make-up, hairpins, clips or nail polish.  Do not wear lotions, powders, or perfumes. You may wear  deodorant.  Do not shave 48 hours prior to surgery. Men may shave face and neck.  Do not bring valuables to the hospital.    [facility] is not responsible for any belongings or valuables.               Contacts, dentures or bridgework may not be worn into surgery.  Leave your suitcase in the car. After surgery it may be brought to your room.  For patients admitted to the hospital, discharge time is determined by your  treatment team.  _  Patients discharged the day of surgery will not be allowed to drive home.  You will need someone to drive you home and stay with you the night of your procedure.    Please read over the following fact sheets that you were given:   [facility] Preparing for Surgery  ____ Take anti-hypertensive listed below, cardiac, seizure, asthma,     anti-reflux and psychiatric medicines. These include:  1. NONE  2.  3.  4.  5.  6.  ____Fleets enema or Magnesium Citrate as directed.   _x___ Use CHG Soap or sage wipes as directed on instruction sheet   ____ Use inhalers on the day of surgery and bring to hospital day of surgery  ____ Stop Metformin and Janumet 2 days prior to surgery.    ____ Take 1/2 of usual insulin dose the night before  surgery and none on the morning surgery.   ____ Follow recommendations from Cardiologist, Pulmonologist or PCP regarding  stopping Aspirin, Coumadin, Plavix ,Eliquis, Effient, or Pradaxa, and Pletal.  X____Stop Anti-inflammatories such as Advil, Aleve, Ibuprofen, Motrin, Naproxen, Naprosyn, Goodies powders or aspirin products NOW-OK to take Tylenol   ____ Stop supplements until after surgery.    ____ Bring C-Pap to the hospital.

## 2018-05-16 MED ORDER — CEFAZOLIN SODIUM-DEXTROSE 2-4 GM/100ML-% IV SOLN
2.0000 g | INTRAVENOUS | Status: AC
  Administered 2018-05-17: 2 g via INTRAVENOUS

## 2018-05-17 ENCOUNTER — Encounter
Admission: RE | Disposition: A | Discharge status: Home / Self Care | Payer: Self-pay | Source: Ambulatory Visit | Attending: General Surgery

## 2018-05-17 ENCOUNTER — Encounter: Payer: Self-pay | Admitting: *Deleted

## 2018-05-17 ENCOUNTER — Ambulatory Visit
Admission: RE | Admit: 2018-05-17 | Discharge: 2018-05-17 | Disposition: A | Discharge status: Home / Self Care | Payer: Self-pay | Source: Ambulatory Visit | Attending: General Surgery | Admitting: General Surgery

## 2018-05-17 ENCOUNTER — Ambulatory Visit: Payer: Self-pay | Admitting: Anesthesiology

## 2018-05-17 ENCOUNTER — Other Ambulatory Visit: Payer: Self-pay

## 2018-05-17 DIAGNOSIS — Z87891 Personal history of nicotine dependence: Secondary | ICD-10-CM | POA: Insufficient documentation

## 2018-05-17 DIAGNOSIS — K8010 Calculus of gallbladder with chronic cholecystitis without obstruction: Secondary | ICD-10-CM | POA: Insufficient documentation

## 2018-05-17 DIAGNOSIS — K8020 Calculus of gallbladder without cholecystitis without obstruction: Secondary | ICD-10-CM

## 2018-05-17 DIAGNOSIS — K219 Gastro-esophageal reflux disease without esophagitis: Secondary | ICD-10-CM | POA: Insufficient documentation

## 2018-05-17 DIAGNOSIS — F419 Anxiety disorder, unspecified: Secondary | ICD-10-CM | POA: Insufficient documentation

## 2018-05-17 DIAGNOSIS — Z791 Long term (current) use of non-steroidal anti-inflammatories (NSAID): Secondary | ICD-10-CM | POA: Insufficient documentation

## 2018-05-17 HISTORY — PX: CHOLECYSTECTOMY: SHX55

## 2018-05-17 LAB — URINE DRUG SCREEN, QUALITATIVE (ARMC ONLY)
Amphetamines, Ur Screen: NOT DETECTED
Barbiturates, Ur Screen: NOT DETECTED
Benzodiazepine, Ur Scrn: NOT DETECTED
Cannabinoid 50 Ng, Ur ~~LOC~~: POSITIVE — AB
Cocaine Metabolite,Ur ~~LOC~~: NOT DETECTED
MDMA (Ecstasy)Ur Screen: NOT DETECTED
Methadone Scn, Ur: NOT DETECTED
Opiate, Ur Screen: NOT DETECTED
Phencyclidine (PCP) Ur S: NOT DETECTED
TRICYCLIC, UR SCREEN: NOT DETECTED

## 2018-05-17 SURGERY — LAPAROSCOPIC CHOLECYSTECTOMY
Anesthesia: General

## 2018-05-17 MED ORDER — LIDOCAINE-EPINEPHRINE 1 %-1:100000 IJ SOLN
INTRAMUSCULAR | Status: DC | PRN
  Administered 2018-05-17: 5 mL

## 2018-05-17 MED ORDER — CHLORHEXIDINE GLUCONATE CLOTH 2 % EX PADS: 6.0000 | MEDICATED_PAD | Freq: Once | CUTANEOUS | Status: DC

## 2018-05-17 MED ORDER — PROPOFOL 500 MG/50ML IV EMUL
INTRAVENOUS | Status: AC
  Filled 2018-05-17: qty 50

## 2018-05-17 MED ORDER — HYDROMORPHONE HCL 1 MG/ML IJ SOLN
INTRAMUSCULAR | Status: AC
  Filled 2018-05-17: qty 1

## 2018-05-17 MED ORDER — ONDANSETRON HCL 4 MG/2ML IJ SOLN
INTRAMUSCULAR | Status: AC
  Filled 2018-05-17: qty 2

## 2018-05-17 MED ORDER — SUGAMMADEX SODIUM 500 MG/5ML IV SOLN
INTRAVENOUS | Status: AC
  Filled 2018-05-17: qty 5

## 2018-05-17 MED ORDER — OXYCODONE HCL 5 MG PO TABS: 5.0000 mg | ORAL_TABLET | Freq: Four times a day (QID) | ORAL | 0 refills | Status: DC | PRN

## 2018-05-17 MED ORDER — CEFAZOLIN SODIUM-DEXTROSE 2-4 GM/100ML-% IV SOLN
INTRAVENOUS | Status: AC
  Filled 2018-05-17: qty 100

## 2018-05-17 MED ORDER — ACETAMINOPHEN 500 MG PO TABS
ORAL_TABLET | ORAL | Status: AC
  Administered 2018-05-17: 1000 mg via ORAL
  Filled 2018-05-17: qty 2

## 2018-05-17 MED ORDER — DEXAMETHASONE SODIUM PHOSPHATE 10 MG/ML IJ SOLN
INTRAMUSCULAR | Status: AC
  Filled 2018-05-17: qty 1

## 2018-05-17 MED ORDER — CELECOXIB 200 MG PO CAPS
ORAL_CAPSULE | ORAL | Status: AC
  Administered 2018-05-17: 200 mg via ORAL
  Filled 2018-05-17: qty 1

## 2018-05-17 MED ORDER — FENTANYL CITRATE (PF) 100 MCG/2ML IJ SOLN
INTRAMUSCULAR | Status: DC | PRN
  Administered 2018-05-17: 100 ug via INTRAVENOUS

## 2018-05-17 MED ORDER — LIDOCAINE-EPINEPHRINE 1 %-1:100000 IJ SOLN
INTRAMUSCULAR | Status: AC
  Filled 2018-05-17: qty 1

## 2018-05-17 MED ORDER — FENTANYL CITRATE (PF) 100 MCG/2ML IJ SOLN
INTRAMUSCULAR | Status: AC
  Filled 2018-05-17: qty 2

## 2018-05-17 MED ORDER — MIDAZOLAM HCL 2 MG/2ML IJ SOLN
INTRAMUSCULAR | Status: AC
  Filled 2018-05-17: qty 2

## 2018-05-17 MED ORDER — BUPIVACAINE HCL (PF) 0.25 % IJ SOLN
INTRAMUSCULAR | Status: DC | PRN
  Administered 2018-05-17: 5 mL

## 2018-05-17 MED ORDER — DEXAMETHASONE SODIUM PHOSPHATE 10 MG/ML IJ SOLN
INTRAMUSCULAR | Status: DC | PRN
  Administered 2018-05-17: 10 mg via INTRAVENOUS

## 2018-05-17 MED ORDER — BUPIVACAINE HCL (PF) 0.25 % IJ SOLN
INTRAMUSCULAR | Status: AC
  Filled 2018-05-17: qty 30

## 2018-05-17 MED ORDER — ROCURONIUM BROMIDE 50 MG/5ML IV SOLN
INTRAVENOUS | Status: AC
  Filled 2018-05-17: qty 1

## 2018-05-17 MED ORDER — CELECOXIB 200 MG PO CAPS
200.0000 mg | ORAL_CAPSULE | ORAL | Status: AC
  Administered 2018-05-17: 200 mg via ORAL

## 2018-05-17 MED ORDER — ROCURONIUM BROMIDE 100 MG/10ML IV SOLN
INTRAVENOUS | Status: DC | PRN
  Administered 2018-05-17: 20 mg via INTRAVENOUS
  Administered 2018-05-17: 50 mg via INTRAVENOUS

## 2018-05-17 MED ORDER — LIDOCAINE HCL (PF) 2 % IJ SOLN
INTRAMUSCULAR | Status: AC
  Filled 2018-05-17: qty 10

## 2018-05-17 MED ORDER — ACETAMINOPHEN 500 MG PO TABS
1000.0000 mg | ORAL_TABLET | ORAL | Status: AC
  Administered 2018-05-17: 1000 mg via ORAL

## 2018-05-17 MED ORDER — PROPOFOL 10 MG/ML IV BOLUS
INTRAVENOUS | Status: DC | PRN
  Administered 2018-05-17: 30 mg via INTRAVENOUS
  Administered 2018-05-17: 50 mg via INTRAVENOUS
  Administered 2018-05-17: 200 mg via INTRAVENOUS

## 2018-05-17 MED ORDER — SUGAMMADEX SODIUM 500 MG/5ML IV SOLN
INTRAVENOUS | Status: DC | PRN
  Administered 2018-05-17: 212 mg via INTRAVENOUS

## 2018-05-17 MED ORDER — LIDOCAINE HCL (CARDIAC) PF 100 MG/5ML IV SOSY
PREFILLED_SYRINGE | INTRAVENOUS | Status: DC | PRN
  Administered 2018-05-17: 100 mg via INTRAVENOUS

## 2018-05-17 MED ORDER — FAMOTIDINE 20 MG PO TABS
ORAL_TABLET | ORAL | Status: AC
  Administered 2018-05-17: 20 mg via ORAL
  Filled 2018-05-17: qty 1

## 2018-05-17 MED ORDER — GABAPENTIN 300 MG PO CAPS
ORAL_CAPSULE | ORAL | Status: AC
  Administered 2018-05-17: 300 mg via ORAL
  Filled 2018-05-17: qty 1

## 2018-05-17 MED ORDER — DEXMEDETOMIDINE HCL IN NACL 80 MCG/20ML IV SOLN
INTRAVENOUS | Status: AC
  Filled 2018-05-17: qty 20

## 2018-05-17 MED ORDER — FAMOTIDINE 20 MG PO TABS
20.0000 mg | ORAL_TABLET | Freq: Once | ORAL | Status: AC
  Administered 2018-05-17: 20 mg via ORAL

## 2018-05-17 MED ORDER — ONDANSETRON HCL 4 MG/2ML IJ SOLN
INTRAMUSCULAR | Status: DC | PRN
  Administered 2018-05-17: 4 mg via INTRAVENOUS

## 2018-05-17 MED ORDER — LACTATED RINGERS IV SOLN
INTRAVENOUS | Status: DC
  Administered 2018-05-17: 08:00:00 via INTRAVENOUS

## 2018-05-17 MED ORDER — GABAPENTIN 300 MG PO CAPS
300.0000 mg | ORAL_CAPSULE | ORAL | Status: AC
  Administered 2018-05-17: 300 mg via ORAL

## 2018-05-17 MED ORDER — HYDROMORPHONE HCL 1 MG/ML IJ SOLN
0.2500 mg | INTRAMUSCULAR | Status: AC | PRN
  Administered 2018-05-17 (×6): 0.25 mg via INTRAVENOUS

## 2018-05-17 MED ORDER — MIDAZOLAM HCL 2 MG/2ML IJ SOLN
INTRAMUSCULAR | Status: DC | PRN
  Administered 2018-05-17: 2 mg via INTRAVENOUS

## 2018-05-17 MED ORDER — DEXMEDETOMIDINE HCL 200 MCG/2ML IV SOLN
INTRAVENOUS | Status: DC | PRN
  Administered 2018-05-17 (×2): 8 ug via INTRAVENOUS

## 2018-05-17 SURGICAL SUPPLY — 58 items
APPLIER CLIP 5 13 M/L LIGAMAX5 (MISCELLANEOUS) ×2
APPLIER CLIP ROT 10 11.4 M/L (STAPLE)
BLADE SURG 11 STRL SS SAFETY (MISCELLANEOUS) IMPLANT
CANISTER SUCT 1200ML W/VALVE (MISCELLANEOUS) ×2 IMPLANT
CANNULA DILATOR 10 W/SLV (CANNULA) IMPLANT
CANNULA DILATOR 5 W/SLV (CANNULA) IMPLANT
CATH CHOLANG 76X19 KUMAR (CATHETERS) IMPLANT
CHLORAPREP W/TINT 26ML (MISCELLANEOUS) ×2 IMPLANT
CLIP APPLIE 5 13 M/L LIGAMAX5 (MISCELLANEOUS) ×1 IMPLANT
CLIP APPLIE ROT 10 11.4 M/L (STAPLE) IMPLANT
CONRAY 60ML FOR OR (MISCELLANEOUS) IMPLANT
COVER WAND RF STERILE (DRAPES) IMPLANT
DECANTER SPIKE VIAL GLASS SM (MISCELLANEOUS) ×2 IMPLANT
DEFOGGER SCOPE WARMER CLEARIFY (MISCELLANEOUS) ×2 IMPLANT
DERMABOND ADVANCED (GAUZE/BANDAGES/DRESSINGS) ×1
DERMABOND ADVANCED .7 DNX12 (GAUZE/BANDAGES/DRESSINGS) ×1 IMPLANT
DISSECTOR KITTNER STICK (MISCELLANEOUS) IMPLANT
DISSECTORS/KITTNER STICK (MISCELLANEOUS)
DRAPE SHEET LG 3/4 BI-LAMINATE (DRAPES) IMPLANT
DRSG TEGADERM 2-3/8X2-3/4 SM (GAUZE/BANDAGES/DRESSINGS) IMPLANT
DRSG TELFA 4X3 1S NADH ST (GAUZE/BANDAGES/DRESSINGS) IMPLANT
ELECT REM PT RETURN 9FT ADLT (ELECTROSURGICAL) ×2
ELECTRODE REM PT RTRN 9FT ADLT (ELECTROSURGICAL) ×1 IMPLANT
GLOVE BIO SURGEON STRL SZ 6.5 (GLOVE) ×2 IMPLANT
GLOVE BIO SURGEON STRL SZ7.5 (GLOVE) IMPLANT
GLOVE INDICATOR 7.0 STRL GRN (GLOVE) ×2 IMPLANT
GLOVE INDICATOR 8.0 STRL GRN (GLOVE) IMPLANT
GOWN STRL REUS W/ TWL LRG LVL3 (GOWN DISPOSABLE) ×3 IMPLANT
GOWN STRL REUS W/TWL LRG LVL3 (GOWN DISPOSABLE) ×3
IRRIGATION STRYKERFLOW (MISCELLANEOUS) ×1 IMPLANT
IRRIGATOR STRYKERFLOW (MISCELLANEOUS) ×2
IV LACTATED RINGERS 1000ML (IV SOLUTION) ×2 IMPLANT
KIT TURNOVER KIT A (KITS) ×2 IMPLANT
LABEL OR SOLS (LABEL) IMPLANT
NDL INSUFF ACCESS 14 VERSASTEP (NEEDLE) IMPLANT
NEEDLE HYPO 22GX1.5 SAFETY (NEEDLE) ×2 IMPLANT
NS IRRIG 500ML POUR BTL (IV SOLUTION) ×2 IMPLANT
PACK LAP CHOLECYSTECTOMY (MISCELLANEOUS) ×2 IMPLANT
PENCIL ELECTRO HAND CTR (MISCELLANEOUS) ×2 IMPLANT
POUCH ENDO CATCH 10MM SPEC (MISCELLANEOUS) ×2 IMPLANT
POUCH SPECIMEN RETRIEVAL 10MM (ENDOMECHANICALS) IMPLANT
SCISSORS METZENBAUM CVD 33 (INSTRUMENTS) ×2 IMPLANT
SLEEVE ADV FIXATION 5X100MM (TROCAR) ×6 IMPLANT
STRIP CLOSURE SKIN 1/2X4 (GAUZE/BANDAGES/DRESSINGS) ×2 IMPLANT
SUT MNCRL 4-0 (SUTURE) ×1
SUT MNCRL 4-0 27XMFL (SUTURE) ×1
SUT VIC AB 0 CT2 27 (SUTURE) IMPLANT
SUT VIC AB 4-0 FS2 27 (SUTURE) IMPLANT
SUT VICRYL 0 AB UR-6 (SUTURE) ×6 IMPLANT
SUT VICRYL+ 3-0 27IN RB-1 (SUTURE) ×2 IMPLANT
SUTURE MNCRL 4-0 27XMF (SUTURE) ×1 IMPLANT
SWABSTK COMLB BENZOIN TINCTURE (MISCELLANEOUS) IMPLANT
TROCAR BALLN GELPORT 12X130M (ENDOMECHANICALS) ×2 IMPLANT
TROCAR XCEL BLUNT TIP 100MML (ENDOMECHANICALS) IMPLANT
TROCAR XCEL NON-BLD 11X100MML (ENDOMECHANICALS) IMPLANT
TROCAR Z-THREAD OPTICAL 5X100M (TROCAR) ×2 IMPLANT
TUBING INSUFFLATION (TUBING) IMPLANT
WATER STERILE IRR 1000ML POUR (IV SOLUTION) IMPLANT

## 2018-05-17 NOTE — Op Note (Signed)
Laparoscopic Cholecystectomy  Pre-operative Diagnosis: symptomatic cholelithiasis  Post-operative Diagnosis: same  Procedure: laparoscopic cholecystectomy  Surgeon: Duanne Guess, MD  Anesthesia: GETA  Assistant:Jamie Teena Dunk   Findings: Uninflamed gallbladder with stones present  Estimated Blood Loss: <5cc         Drains: non         Specimens: Gallbladder           Complications: none   Procedure Details  The patient was seen again in the preoperative holding area. The benefits, complications, treatment options, and expected outcomes were discussed with the patient. The risks of bleeding, infection, recurrence of symptoms, failure to resolve symptoms, bile duct damage, bile duct leak, retained common bile duct stone, bowel injury, any of which could require further surgery and/or ERCP, stent, or papillotomy were reviewed with the patient. The likelihood of improving the patient's symptoms with return to their baseline status is good.  The patient and/or family concurred with the proposed plan, giving informed consent.  The patient was taken to operating room, identified as Mario [name] and the procedure verified as Laparoscopic Cholecystectomy. A time out was performed and the above information confirmed.  Prior to the induction of general anesthesia, antibiotic prophylaxis was administered. VTE prophylaxis was in place. General endotracheal anesthesia was then administered and tolerated well. After the induction, the abdomen was prepped with Chloraprep and draped in the sterile fashion. The patient was positioned in the supine position.  Cut down technique was used to enter the abdominal cavity and a Hasson trochar was placed after two vicryl stitches were anchored to the fascia. Pneumoperitoneum was then created with CO2 and tolerated well without any adverse changes in the patient's vital signs.  Three 5-mm ports were placed in the right upper quadrant all under direct  vision. All skin incisions  were infiltrated with a local anesthetic agent before making the incision and placing the trocars.   The patient was positioned  in reverse Trendelenburg, tilted slightly to the patient's left.  The gallbladder was identified, the fundus grasped and retracted cephalad. Adhesions were lysed bluntly. The infundibulum was grasped and retracted laterally, exposing the peritoneum overlying the triangle of Calot. This was then divided and exposed in a blunt fashion. An extended critical view of the cystic duct and cystic artery was obtained.  The cystic duct was clearly identified and bluntly dissected free. Both the cystic artery and duct were double clipped and divided.  The gallbladder was taken from the gallbladder fossa in a retrograde fashion with the electrocautery. The gallbladder was removed and placed in an Endocatch bag. The liver bed was irrigated and inspected. Hemostasis was achieved with the electrocautery. Copious saline irrigation was utilized and was repeatedly aspirated until clear.  The gallbladder and Endocatch sac were then removed through a port site.   Inspection of the right upper quadrant was performed. No bleeding, bile duct injury or leak, or bowel injury was noted. Pneumoperitoneum was released.  The periumbilical port site was closed with interrumpted 0 Vicryl sutures. 4-0 subcuticular Monocryl was used to close the skin. Dermabond was applied.  The patient was then extubated and brought to the recovery room in stable condition. Sponge, lap, and needle counts were correct at closure and at the conclusion of the case.               Duanne Guess, MD, FACS

## 2018-05-17 NOTE — Discharge Instructions (Signed)

## 2018-05-17 NOTE — Anesthesia Post-op Follow-up Note (Signed)
Anesthesia QCDR form completed.        

## 2018-05-17 NOTE — Interval H&P Note (Signed)
History and Physical Interval Note:  05/17/2018 7:24 AM  Mario [name]  has presented today for surgery, with the diagnosis of SYMPTOMATIC CHOLELITHIASIS  The various methods of treatment have been discussed with the patient and family. After consideration of risks, benefits and other options for treatment, the patient has consented to  Procedure(s): LAPAROSCOPIC CHOLECYSTECTOMY WITH POSSIBLE INTRAOPERATIVE CHOLANGIOGRAM (N/A) as a surgical intervention .  The patient's history has been reviewed, patient examined, no change in status, stable for surgery.  I have reviewed the patient's chart and labs.  Questions were answered to the patient's satisfaction.     Duanne Guess

## 2018-05-17 NOTE — Transfer of Care (Signed)
Immediate Anesthesia Transfer of Care Note  Patient: Mario [name]  Procedure(s) Performed: LAPAROSCOPIC CHOLECYSTECTOMY (N/A )  Patient Location: PACU  Anesthesia Type:General  Level of Consciousness: sedated  Airway & Oxygen Therapy: Patient connected to face mask oxygen  Post-op Assessment: Post -op Vital signs reviewed and stable  Post vital signs: stable  Last Vitals:  Vitals Value Taken Time  BP 116/74 05/17/2018  9:35 AM  Temp 36.8 C 05/17/2018  9:35 AM  Pulse 71 05/17/2018  9:36 AM  Resp 19 05/17/2018  9:36 AM  SpO2 100 % 05/17/2018  9:36 AM  Vitals shown include unvalidated device data.  Last Pain:  Vitals:   05/17/18 0636  TempSrc: Tympanic  PainSc: 2          Complications: No apparent anesthesia complications

## 2018-05-17 NOTE — Anesthesia Procedure Notes (Signed)
Procedure Name: Intubation Date/Time: 05/17/2018 7:50 AM Performed by: Aline Brochure, CRNA Pre-anesthesia Checklist: Patient identified, Emergency Drugs available, Suction available and Patient being monitored Patient Re-evaluated:Patient Re-evaluated prior to induction Oxygen Delivery Method: Circle system utilized Preoxygenation: Pre-oxygenation with 100% oxygen Induction Type: IV induction Ventilation: Mask ventilation without difficulty Laryngoscope Size: Mac and 4 Grade View: Grade II Tube type: Oral Tube size: 7.5 mm Number of attempts: 1 Airway Equipment and Method: Stylet Placement Confirmation: ETT inserted through vocal cords under direct vision,  positive ETCO2 and breath sounds checked- equal and bilateral Secured at: 23 cm Tube secured with: Tape Dental Injury: Teeth and Oropharynx as per pre-operative assessment  Difficulty Due To: Difficult Airway- due to anterior larynx

## 2018-05-17 NOTE — Anesthesia Preprocedure Evaluation (Addendum)
Anesthesia Evaluation  Patient identified by MRN, date of birth, ID band Patient awake    Reviewed: Allergy & Precautions, H&P , NPO status , Patient's Chart, lab work & pertinent test results  History of Anesthesia Complications Negative for: history of anesthetic complications  Airway Mallampati: II       Dental  (+) Teeth Intact   Pulmonary former smoker,           Cardiovascular negative cardio ROS       Neuro/Psych Seizures -,  Anxiety    GI/Hepatic Neg liver ROS, GERD  ,  Endo/Other  negative endocrine ROS  Renal/GU      Musculoskeletal   Abdominal   Peds  Hematology negative hematology ROS (+)   Anesthesia Other Findings Past Medical History: No date: Anxiety No date: GERD (gastroesophageal reflux disease)     Comment:  OCC No date: Seizures (HCC)     Comment:  32 years old X1 No date: Substance abuse (HCC)     Comment:  as a teenager  Past Surgical History: 11/2017: OTHER SURGICAL HISTORY; Right     Comment:  right cyst shoulder excision     Reproductive/Obstetrics negative OB ROS                            Anesthesia Physical Anesthesia Plan  ASA: II  Anesthesia Plan: General ETT   Post-op Pain Management:    Induction: Intravenous  PONV Risk Score and Plan: Ondansetron, Dexamethasone, Midazolam and Treatment may vary due to age or medical condition  Airway Management Planned: Oral ETT  Additional Equipment:   Intra-op Plan:   Post-operative Plan:   Informed Consent: I have reviewed the patients History and Physical, chart, labs and discussed the procedure including the risks, benefits and alternatives for the proposed anesthesia with the patient or authorized representative who has indicated his/her understanding and acceptance.   Dental Advisory Given  Plan Discussed with: Anesthesiologist, CRNA and Surgeon  Anesthesia Plan Comments:         Anesthesia Quick Evaluation

## 2018-05-18 ENCOUNTER — Encounter: Payer: Self-pay | Admitting: General Surgery

## 2018-05-18 LAB — SURGICAL PATHOLOGY

## 2018-05-20 NOTE — Anesthesia Postprocedure Evaluation (Signed)
Anesthesia Post Note  Patient: Mario [name]  Procedure(s) Performed: LAPAROSCOPIC CHOLECYSTECTOMY (N/A )  Patient location during evaluation: PACU Anesthesia Type: General Level of consciousness: awake and alert Pain management: pain level controlled Vital Signs Assessment: post-procedure vital signs reviewed and stable Respiratory status: spontaneous breathing, nonlabored ventilation, respiratory function stable and patient connected to nasal cannula oxygen Cardiovascular status: blood pressure returned to baseline and stable Postop Assessment: no apparent nausea or vomiting Anesthetic complications: no     Last Vitals:  Vitals:   05/17/18 1034 05/17/18 1135  BP: 121/87 120/75  Pulse: 83 85  Resp: 18 16  Temp: (!) 36.3 C   SpO2: 100% 100%    Last Pain:  Vitals:   05/18/18 0921  TempSrc:   PainSc: 2                  Jovita Gamma

## 2018-06-01 ENCOUNTER — Other Ambulatory Visit: Payer: Self-pay

## 2018-06-01 ENCOUNTER — Ambulatory Visit (INDEPENDENT_AMBULATORY_CARE_PROVIDER_SITE_OTHER): Payer: Self-pay | Admitting: General Surgery

## 2018-06-01 ENCOUNTER — Encounter: Payer: Self-pay | Admitting: General Surgery

## 2018-06-01 VITALS — BP 122/68 | HR 79 | Temp 97.9°F | Ht 74.0 in | Wt 229.0 lb

## 2018-06-01 DIAGNOSIS — Z9049 Acquired absence of other specified parts of digestive tract: Secondary | ICD-10-CM

## 2018-06-01 NOTE — Progress Notes (Signed)
Delon Revelo is here today for a postoperative visit after undergoing an uncomplicated cholecystectomy about 2 weeks ago.  His final pathology was consistent with cholelithiasis and chronic cholecystitis.  He states that he has done well since his operation.  He has not had any significant pain.  He denies fevers or chills.  His appetite is normal.  He did eat some fatty food yesterday and did have some loose stool as a result.  He otherwise has been watching what he eats as he had been cautioned.  Vitals:   06/01/18 1413  BP: 122/68  Pulse: 79  Temp: 97.9 F (36.6 C)  SpO2: 98%   Focused abdominal examination: Surgical trocar sites are well approximated and healing nicely.  There is no erythema induration or drainage present from any of the sites.  There is a small amount of scabbing at the periumbilical site.  A/P: He is doing well after his operation.  I have recommended that he continue to refrain from lifting anything heavier than 10 pounds for another week.  He is otherwise cleared to resume all of his usual activities.  At this time no further follow-up is necessary however he is welcome to return at any time should questions or concerns arise.

## 2018-06-01 NOTE — Patient Instructions (Addendum)
Patient is to return to the office as needed. 1 more week of no heavy lifting.   Call the office with any questions or concerns.

## 2018-07-13 ENCOUNTER — Other Ambulatory Visit: Payer: Self-pay

## 2018-07-13 ENCOUNTER — Encounter: Payer: Self-pay | Admitting: General Surgery

## 2018-07-13 ENCOUNTER — Ambulatory Visit (INDEPENDENT_AMBULATORY_CARE_PROVIDER_SITE_OTHER): Payer: Self-pay | Admitting: General Surgery

## 2018-07-13 VITALS — BP 133/78 | HR 83 | Temp 97.3°F | Resp 16 | Ht 74.0 in | Wt 222.4 lb

## 2018-07-13 DIAGNOSIS — Z9049 Acquired absence of other specified parts of digestive tract: Secondary | ICD-10-CM

## 2018-07-13 NOTE — Patient Instructions (Addendum)
The patient is aware to call back for any questions or new concerns or change in symptoms. May use gas x when you have the abdominal discomfort as a trial May use "over the counter" Prilosec daily to help reduce reflux symptoms Keep appointment with Dr Burnett Sheng

## 2018-07-13 NOTE — Progress Notes (Signed)
Mario [name] is here today for evaluation of abdominal pain.  He underwent an uncomplicated laparoscopic cholecystectomy on 17 May 2018.  He was seen on 01 June 2018 for his routine postoperative visit, at which time he was doing very well.  He contacted our office because he has been having some abdominal pain over the past couple of weeks.  He says that it is at most, a 3 or 4 out of 10 on the pain scale.  It is sometimes in the left lower quadrant, sometimes periumbilical, sometimes diffuse or mid epigastric.  He denies nausea or vomiting.  He has not had any fevers or chills.  He states that sometimes certain foods will give him diarrhea; other times though same foods will be easily tolerated.  He endorses acid reflux and also bloating.  He says he will occasionally take Tums for the reflux, but is not on anything on a regular basis. Past Medical History:  Diagnosis Date  . Anxiety   . GERD (gastroesophageal reflux disease)    OCC  . Seizures (HCC)    33 years old X1  . Substance abuse [facility])    as a teenager   Past Surgical History:  Procedure Laterality Date  . CHOLECYSTECTOMY N/A 05/17/2018   Procedure: LAPAROSCOPIC CHOLECYSTECTOMY;  Surgeon: Duanne Guess, MD;  Location: [facility] ORS;  Service: General;  Laterality: N/A;  . OTHER SURGICAL HISTORY Right 11/2017   right cyst shoulder excision   Family History  Problem Relation Age of Onset  . Cancer Maternal Aunt   . Cancer Maternal Grandmother    Social History   Tobacco Use  . Smoking status: Former Smoker    Packs/day: 1.00    Years: 10.00    Pack years: 10.00    Types: Cigarettes    Last attempt to quit: 05/10/2017    Years since quitting: 1.1  . Smokeless tobacco: Never Used  Substance Use Topics  . Alcohol use: Yes    Comment: WINE OCC  . Drug use: Yes    Types: Marijuana    Comment: DAILY   Current Meds  Medication Sig  . acetaminophen (TYLENOL) 500 MG tablet Take 500 mg by mouth every 6 (six) hours as  needed for moderate pain.  . calcium carbonate (TUMS - DOSED IN MG ELEMENTAL CALCIUM) 500 MG chewable tablet Chew 1 tablet by mouth as needed for indigestion or heartburn.  Marland Kitchen ibuprofen (ADVIL,MOTRIN) 200 MG tablet Take 800 mg by mouth every 6 (six) hours as needed for headache or mild pain.  . [DISCONTINUED] oxyCODONE (OXY IR/ROXICODONE) 5 MG immediate release tablet Take 1 tablet (5 mg total) by mouth every 6 (six) hours as needed for severe pain.   No Known Allergies  Vitals:   07/13/18 1422  BP: 133/78  Pulse: 83  Resp: 16  Temp: (!) 97.3 F (36.3 C)  SpO2: 98%   Gen: A&O x3, NAD. HEENT: [place]/AT. MMM.  Pulm: normal WOB on RA CV: RRR Abd: Soft, nondistended, laparoscopic trocar sites healing well.  No hernias.  Mild tenderness to deep palpation in the mid abdomen, just to the left of midline.  A/P: Mario [name] is a 33 year old male who underwent an uncomplicated laparoscopic cholecystectomy.  He returned to work full-time a few weeks ago.  He thinks that perhaps, his abdominal pain coincides with this.  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  He also has bloating.  This is not uncommon after cholecystectomy.  I have recommended  that he try an over-the-counter product containing simethicone, such as Gas-X.  For his acid reflux, I have suggested that he try an over-the-counter proton pump inhibitor such as Prilosec (omeprazole).  This is to be taken on a daily basis rather than as needed.  I think the intermittent loose stools are also a reflection of his recent postcholecystectomy state.  Unfortunately, I do not have a unifying diagnosis to explain all of his symptoms.  We will therefore treat them individually by symptom.  He also has a follow-up visit with his primary care provider in the next month.  I have asked him to address these questions with the PCP, if they have not resolved with these conservative interventions.  We will see him on an as-needed  basis.

## 2019-05-15 ENCOUNTER — Encounter: Payer: Self-pay | Admitting: Urology

## 2019-05-15 ENCOUNTER — Other Ambulatory Visit: Payer: Self-pay

## 2019-05-15 ENCOUNTER — Ambulatory Visit (INDEPENDENT_AMBULATORY_CARE_PROVIDER_SITE_OTHER): Payer: Self-pay | Admitting: Urology

## 2019-05-15 VITALS — BP 130/80 | HR 79 | Ht 74.0 in | Wt 235.0 lb

## 2019-05-15 DIAGNOSIS — N5082 Scrotal pain: Secondary | ICD-10-CM

## 2019-05-15 DIAGNOSIS — R3911 Hesitancy of micturition: Secondary | ICD-10-CM

## 2019-05-15 LAB — URINALYSIS, COMPLETE
Bilirubin, UA: NEGATIVE
Glucose, UA: NEGATIVE
Ketones, UA: NEGATIVE
Leukocytes,UA: NEGATIVE
Nitrite, UA: NEGATIVE
Protein,UA: NEGATIVE
RBC, UA: NEGATIVE
Specific Gravity, UA: 1.01 (ref 1.005–1.030)
Urobilinogen, Ur: 0.2 mg/dL (ref 0.2–1.0)
pH, UA: 7 (ref 5.0–7.5)

## 2019-05-15 LAB — MICROSCOPIC EXAMINATION
Bacteria, UA: NONE SEEN
Epithelial Cells (non renal): NONE SEEN /hpf (ref 0–10)
RBC, Urine: NONE SEEN /hpf (ref 0–2)
WBC, UA: NONE SEEN /hpf (ref 0–5)

## 2019-05-15 MED ORDER — TAMSULOSIN HCL 0.4 MG PO CAPS: 0.4000 mg | ORAL_CAPSULE | Freq: Every day | ORAL | 1 refills | Status: AC

## 2019-05-15 NOTE — Progress Notes (Signed)
05/15/2019 2:36 PM   Benna Dunks 1985-09-19 [id number]  Referring provider: Maryland Pink, MD [address] [facility] [place],  [place] [address]  Chief Complaint  Patient presents with  . Testicle Pain    HPI: Mario [name] is a 33 y.o. male seen in consultation at request of Dr. Kary Kos for evaluation of scrotal content pain.  In August 2020 he had onset of left scrotal pain and felt the "tubes around the left testis" were swollen.  He was empirically treated with Rocephin and doxycycline with pain improvement but no resolution.  Approximately 1 month ago he noted a small lump on the back part of his right testis and mild left scrotal discomfort.  He has intermittent pain.  Severity at its worse is rated 3/10.  His pain will be worse with wearing tighter clothing.  He does note intermittent urinary hesitancy, spraying of his stream and occasional vague penile pain.  Denies previous history of urologic problems.   PMH: Past Medical History:  Diagnosis Date  . Anxiety   . GERD (gastroesophageal reflux disease)    OCC  . Seizures ([place])    33 years old X1  . Substance abuse [facility])    as a teenager    Surgical History: Past Surgical History:  Procedure Laterality Date  . CHOLECYSTECTOMY N/A 05/17/2018   Procedure: LAPAROSCOPIC CHOLECYSTECTOMY;  Surgeon: Fredirick Maudlin, MD;  Location: [facility] ORS;  Service: General;  Laterality: N/A;  . OTHER SURGICAL HISTORY Right 11/2017   right cyst shoulder excision    Home Medications:  Allergies as of 05/15/2019   No Known Allergies     Medication List       Accurate as of May 15, 2019  2:36 PM. If you have any questions, ask your nurse or doctor.        acetaminophen 500 MG tablet Commonly known as: TYLENOL Take 500 mg by mouth every 6 (six) hours as needed for moderate pain.   calcium carbonate 500 MG chewable tablet Commonly known as: TUMS - dosed in mg elemental calcium Chew 1 tablet by mouth as needed  for indigestion or heartburn.   ibuprofen 200 MG tablet Commonly known as: ADVIL Take 800 mg by mouth every 6 (six) hours as needed for headache or mild pain.       Allergies: No Known Allergies  Family History: Family History  Problem Relation Age of Onset  . Cancer Maternal Aunt   . Cancer Maternal Grandmother     Social History:  reports that he quit smoking about 2 years ago. His smoking use included cigarettes. He has a 10.00 pack-year smoking history. He has never used smokeless tobacco. He reports current alcohol use. He reports current drug use. Drug: Marijuana.  ROS: UROLOGY Frequent Urination?: No Hard to postpone urination?: No Burning/pain with urination?: No Get up at night to urinate?: No Leakage of urine?: No Urine stream starts and stops?: No Trouble starting stream?: No Do you have to strain to urinate?: No Blood in urine?: No Urinary tract infection?: No Sexually transmitted disease?: No Injury to kidneys or bladder?: No Painful intercourse?: No Weak stream?: No Erection problems?: No Penile pain?: No  Gastrointestinal Nausea?: No Vomiting?: No Indigestion/heartburn?: Yes Diarrhea?: No Constipation?: No  Constitutional Fever: No Night sweats?: No Weight loss?: No Fatigue?: No  Skin Skin rash/lesions?: No Itching?: No  Eyes Blurred vision?: No Double vision?: No  Ears/Nose/Throat Sore throat?: No Sinus problems?: No  Hematologic/Lymphatic Swollen glands?: No Easy  bruising?: No  Cardiovascular Leg swelling?: No Chest pain?: No  Respiratory Cough?: No Shortness of breath?: No  Endocrine Excessive thirst?: No  Musculoskeletal Back pain?: No Joint pain?: No  Neurological Headaches?: No Dizziness?: No  Psychologic Depression?: No Anxiety?: No  Physical Exam: BP 130/80   Pulse 79   Ht 6\' 2"  (1.88 m)   Wt 235 lb (106.6 kg)   BMI 30.17 kg/m   Constitutional:  Alert and oriented, No acute distress. HEENT: [place]  AT, moist mucus membranes.  Trachea midline, no masses. Cardiovascular: No clubbing, cyanosis, or edema. Respiratory: Normal respiratory effort, no increased work of breathing. GI: Abdomen is soft, nontender, nondistended, no abdominal masses GU: Phallus circumcised without lesions, testes descended bilaterally without masses or tenderness.  Mild prominence of the right epididymis with mild tenderness.  No left tenderness. Skin: No rashes, bruises or suspicious lesions. Neurologic: Grossly intact, no focal deficits, moving all 4 extremities. Psychiatric: Normal mood and affect.   Assessment & Plan:    -Scrotal content pain Urinalysis today was normal.  He does have mild obstructive voiding symptoms and penile pain and this may be referred secondary inflammatory prostatitis.  Trial tamsulosin 0.4 mg daily.  Schedule scrotal ultrasound.  Follow-up appointment approximately 1 month for symptom reassessment.   , MD  [facility] [address], [address] [place], [place] [phone]

## 2019-06-06 ENCOUNTER — Ambulatory Visit
Admission: RE | Admit: 2019-06-06 | Discharge: 2019-06-06 | Disposition: A | Discharge status: Home / Self Care | Payer: Self-pay | Source: Ambulatory Visit | Attending: Urology | Admitting: Urology

## 2019-06-06 ENCOUNTER — Other Ambulatory Visit: Payer: Self-pay

## 2019-06-06 ENCOUNTER — Ambulatory Visit: Payer: Self-pay

## 2019-06-06 DIAGNOSIS — N5082 Scrotal pain: Secondary | ICD-10-CM | POA: Insufficient documentation

## 2019-06-07 ENCOUNTER — Telehealth: Payer: Self-pay | Admitting: Urology

## 2019-06-07 NOTE — Telephone Encounter (Signed)
Pt would wants to know if he can get his [place] results via virtual call instead of in person, he has an upcoming appt 12/29 @ 2pm - reason being pt wants to limit exposure and is also self pay and can't afford an in office visit. I was not able to quote price of in office vs virtual. Please advise.

## 2019-06-12 NOTE — Telephone Encounter (Signed)
I changed it to a telephone visit for tomorrow and I changed the time to 12:45 right before afternoon clinic starts back because I thought it would be easier to call him before you started than to try to do one in the middle of your clinic. Is that ok?    Sharyn Lull

## 2019-06-13 ENCOUNTER — Telehealth (INDEPENDENT_AMBULATORY_CARE_PROVIDER_SITE_OTHER): Payer: Self-pay | Admitting: Urology

## 2019-06-13 ENCOUNTER — Other Ambulatory Visit: Payer: Self-pay

## 2019-06-13 DIAGNOSIS — K4090 Unilateral inguinal hernia, without obstruction or gangrene, not specified as recurrent: Secondary | ICD-10-CM

## 2019-06-13 DIAGNOSIS — N5082 Scrotal pain: Secondary | ICD-10-CM

## 2019-06-13 NOTE — Telephone Encounter (Signed)
done

## 2019-06-13 NOTE — Progress Notes (Signed)
Virtual Visit via Telephone Note  I connected with Mario [name] on 06/13/19 at 12:45 PM EST by telephone and verified that I am speaking with the correct person using two identifiers.  Location: Patient: Work Provider: [place]   I discussed the limitations, risks, security and privacy concerns of performing an evaluation and management service by telephone and the availability of in person appointments. I also discussed with the patient that there may be a patient responsible charge related to this service. The patient expressed understanding and agreed to proceed.   History of Present Illness: 33 y.o. male seen last month with left scrotal content pain.  He was having mild obstructive voiding symptoms and was given a trial of tamsulosin.  A scrotal ultrasound was also ordered which did show a small left inguinal hernia.  No testicular or paratesticular abnormalities were noted.  Overall he feels he is doing better.  His pain is minimal.   Observations/Objective: N/A  Assessment and Plan: Left scrotal content pain-improved.  His left inguinal hernia was not palpable on exam.  This is most likely an incidental finding and not a significant contributing factor to his left scrotal content pain.  I offered him a general surgery evaluation however he wants to hold off.  His pain is much better and if it worsens he may want to proceed with a surgery evaluation.  I will be happy to see him back as needed.  Follow Up Instructions: Follow-up prn   I discussed the assessment and treatment plan with the patient. The patient was provided an opportunity to ask questions and all were answered. The patient agreed with the plan and demonstrated an understanding of the instructions.   The patient was advised to call back or seek an in-person evaluation if the symptoms worsen or if the condition fails to improve as anticipated.  I provided 10 minutes of non-face-to-face time during this  encounter.   Abbie Sons, MD

## 2019-08-13 IMAGING — US US ABDOMEN LIMITED
1 series · 14 of 25 positions shown · non-contrast
Comparison: None.

CLINICAL DATA: Right upper quadrant pain. Normal liver function
studies.

EXAM:
ULTRASOUND ABDOMEN LIMITED RIGHT UPPER QUADRANT

[Series 1: us abdomen limited · 0.22mm/px · 14 of 49 slices shown]
[im 1/49]
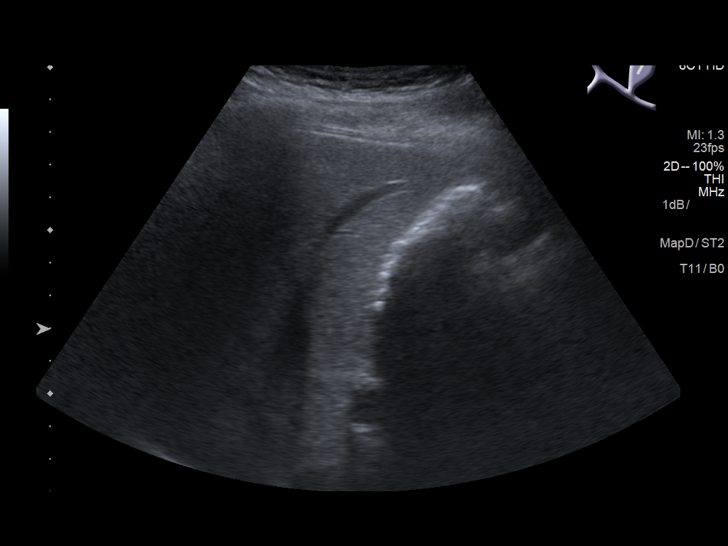
[im 5/49]
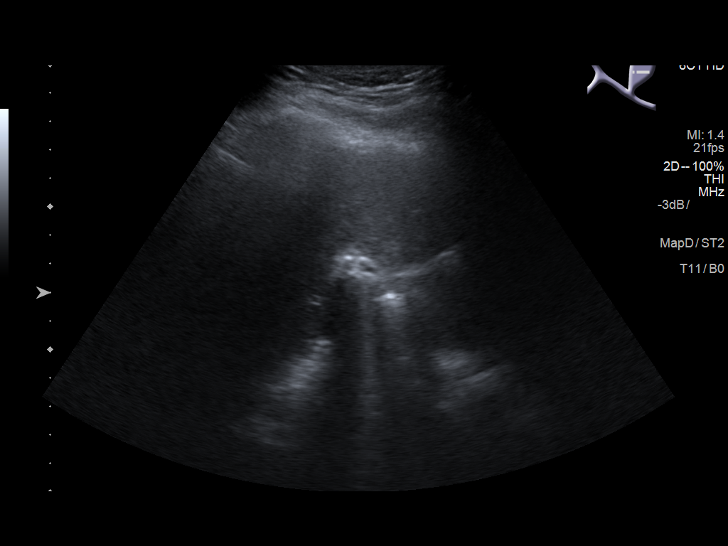
[im 9/49]
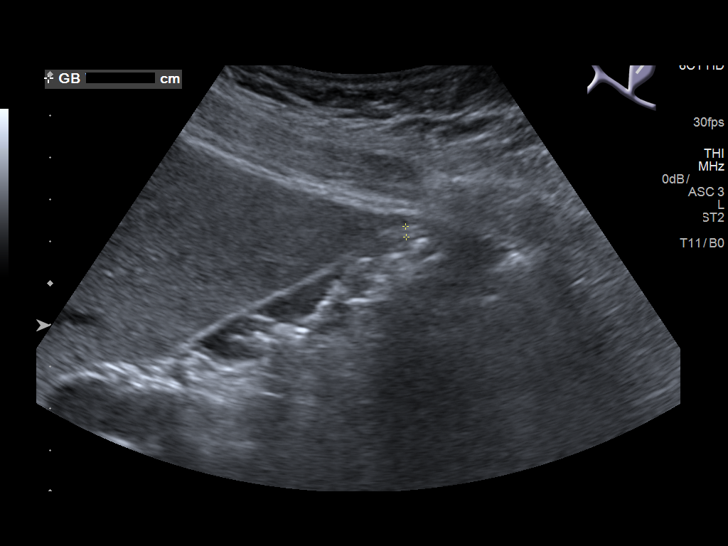
[im 13/49]
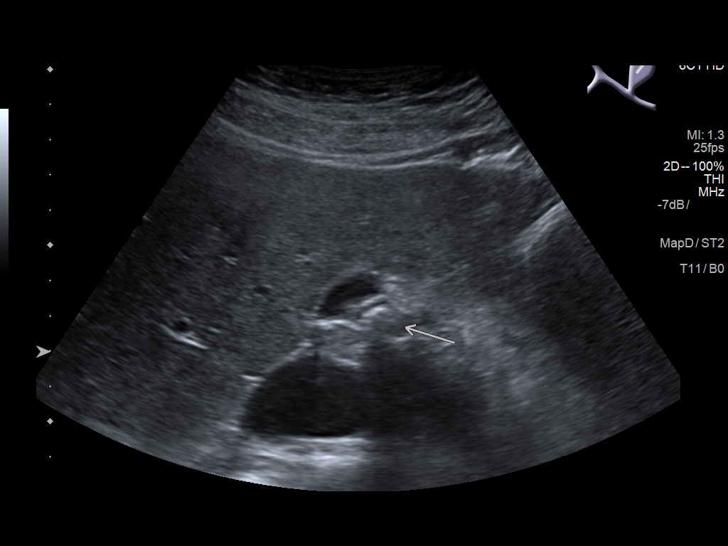
[im 17/49]
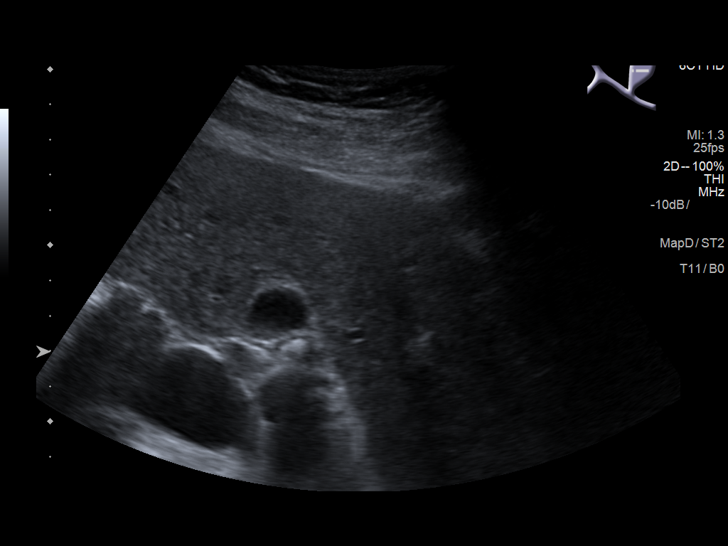
[im 19/49]
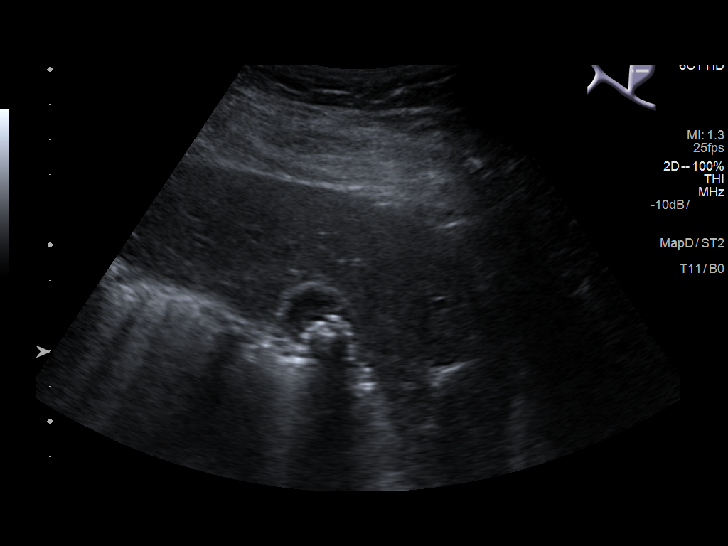
[im 23/49]
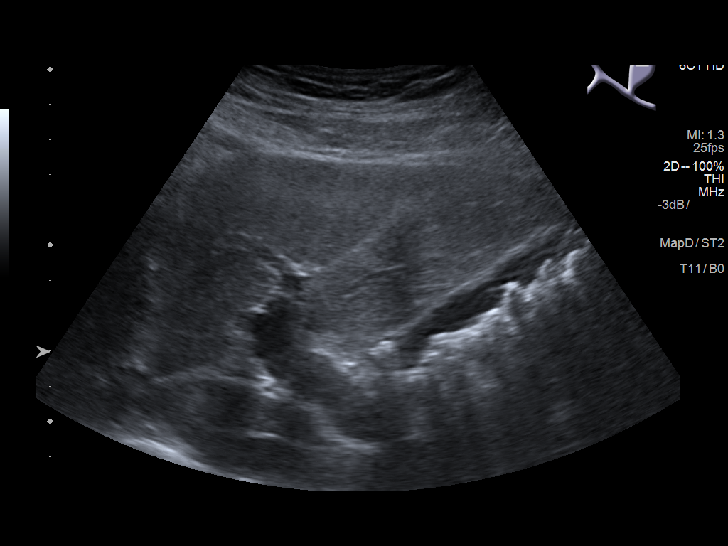
[im 27/49]
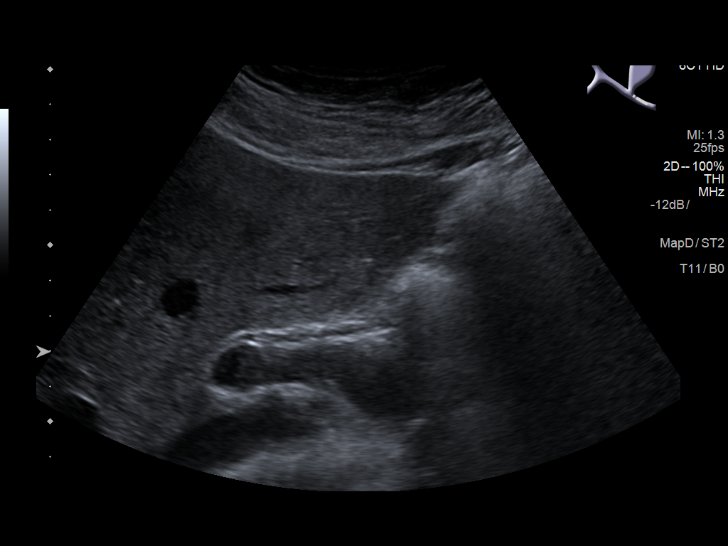
[im 31/49]
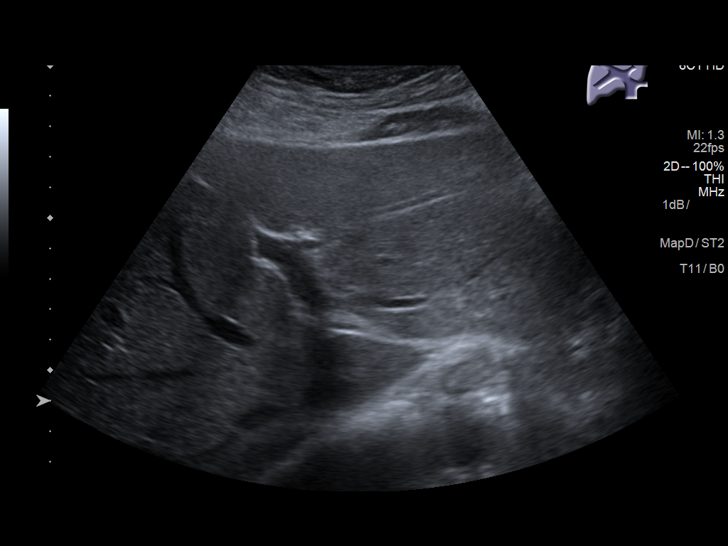
[im 33/49]
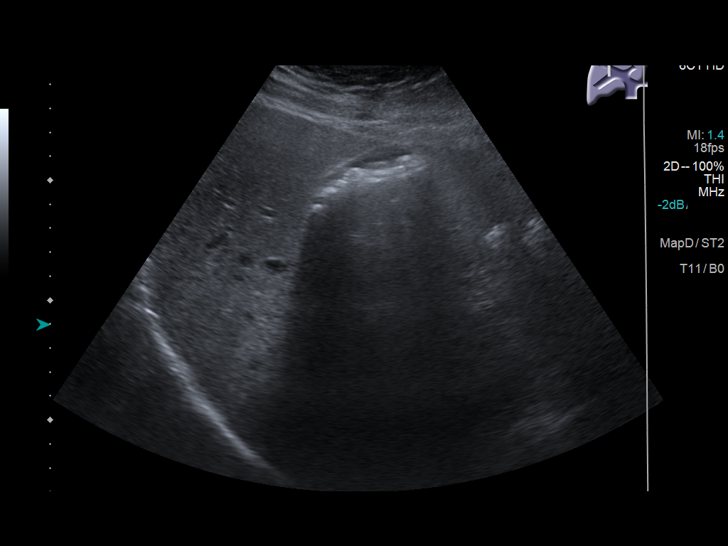
[im 37/49]
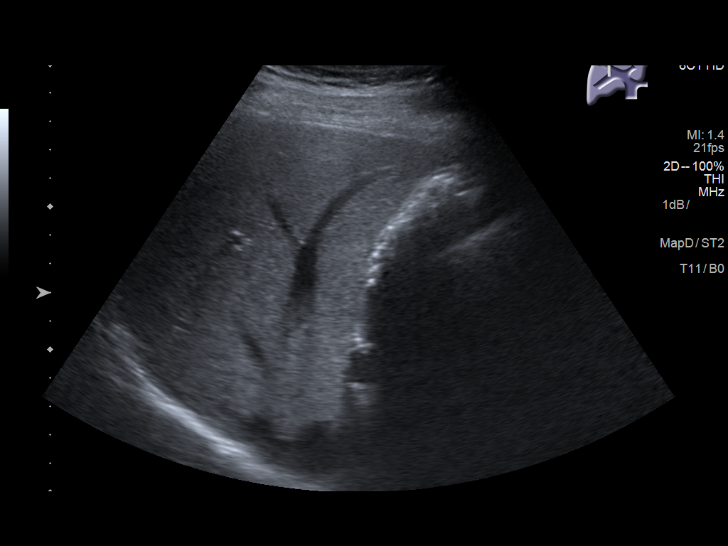
[im 41/49]
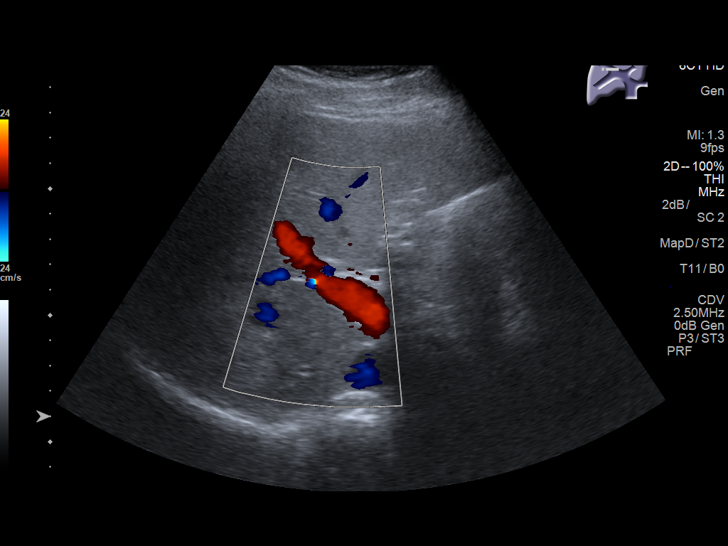
[im 45/49]
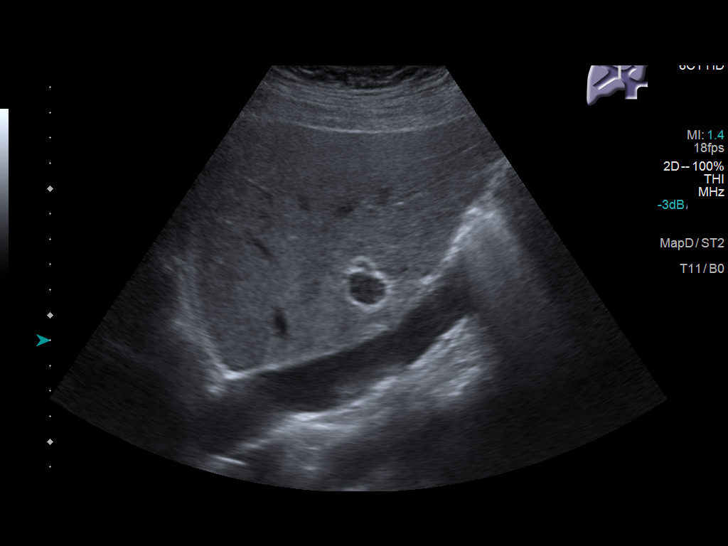
[im 49/49]
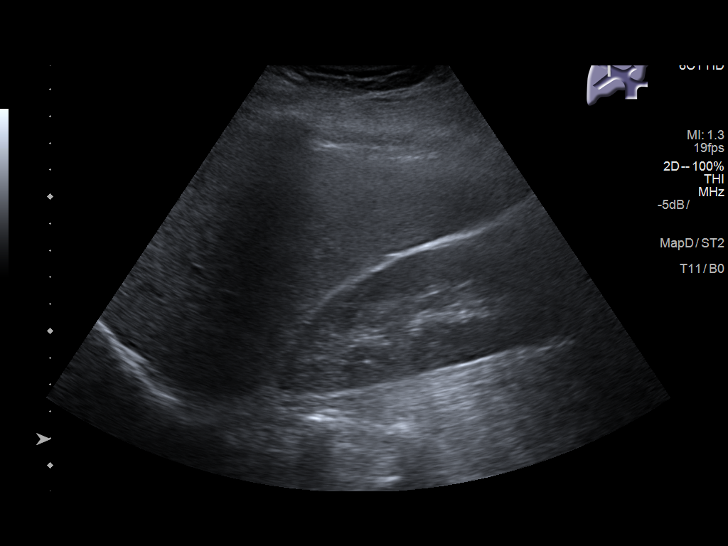

[14 of 25 positions shown; findings below may reference images not displayed]

FINDINGS: Gallbladder:

Multiple stones are demonstrated throughout the gallbladder
resulting in wall echo shadow complex. Stones measure up to 9 mm. A
stone is demonstrated in the gallbladder neck. No gallbladder wall
thickening or edema. Murphy's sign is negative.

Common bile duct:

Diameter: 2.4 mm, normal

Liver:

No focal lesion identified. Within normal limits in parenchymal
echogenicity. Portal vein is patent on color Doppler imaging with
normal direction of blood flow towards the liver.
IMPRESSION: Cholelithiasis with multiple stones in the gallbladder. No
additional changes to suggest acute cholecystitis.

## 2021-03-07 ENCOUNTER — Encounter: Payer: Self-pay | Admitting: General Surgery

## 2021-08-13 ENCOUNTER — Other Ambulatory Visit (HOSPITAL_COMMUNITY): Payer: Self-pay | Admitting: Gastroenterology

## 2021-08-13 ENCOUNTER — Other Ambulatory Visit: Payer: Self-pay | Admitting: Gastroenterology

## 2021-08-13 DIAGNOSIS — K5090 Crohn's disease, unspecified, without complications: Secondary | ICD-10-CM

## 2021-08-25 ENCOUNTER — Other Ambulatory Visit: Payer: Self-pay

## 2021-08-25 ENCOUNTER — Ambulatory Visit (HOSPITAL_COMMUNITY)
Admission: RE | Admit: 2021-08-25 | Discharge: 2021-08-25 | Disposition: A | Discharge status: Home / Self Care | Payer: 59 | Source: Ambulatory Visit | Attending: Gastroenterology | Admitting: Gastroenterology

## 2021-08-25 DIAGNOSIS — K5090 Crohn's disease, unspecified, without complications: Secondary | ICD-10-CM | POA: Diagnosis present

## 2021-08-25 MED ORDER — BARIUM SULFATE 0.1 % PO SUSP
ORAL | Status: AC
  Administered 2021-08-25: 1300 mL via ORAL
  Filled 2021-08-25: qty 3

## 2021-08-25 MED ORDER — IOHEXOL 300 MG/ML  SOLN
100.0000 mL | Freq: Once | INTRAMUSCULAR | Status: AC | PRN
  Administered 2021-08-25: 100 mL via INTRAVENOUS

## 2022-07-13 DIAGNOSIS — R3913 Splitting of urinary stream: Secondary | ICD-10-CM | POA: Diagnosis not present

## 2022-07-13 DIAGNOSIS — R3912 Poor urinary stream: Secondary | ICD-10-CM | POA: Diagnosis not present

## 2022-09-09 DIAGNOSIS — Z3009 Encounter for other general counseling and advice on contraception: Secondary | ICD-10-CM | POA: Diagnosis not present

## 2023-02-08 IMAGING — CT CT ENTEROGRAPHY (ABD-PELV W/ CM)
2 of 5 series · 16 of 46 positions shown, 18 images · IV contrast (agent unspecified)
Comparison: Ultrasound May 03, 2018

CLINICAL DATA: Crohn's disease without complication, unspecified
gastrointestinal tract location (HCC) .

EXAM:
CT ABDOMEN AND PELVIS WITH CONTRAST (ENTEROGRAPHY)
TECHNIQUE: Multidetector CT of the abdomen and pelvis during bolus
administration of intravenous contrast. Negative oral contrast was
given.

[Series 4: enterography thins · axial · 0.77mm/px · z∈[-462,-55]mm · 13 of 269 slices shown, 15 images]
[im 15/269  soft-tissue]
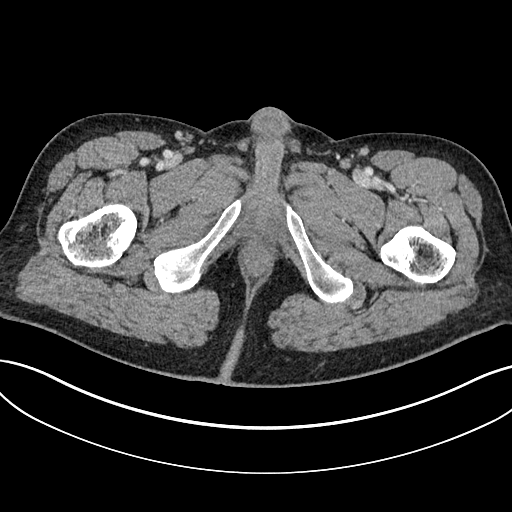
[im 15/269  bone]
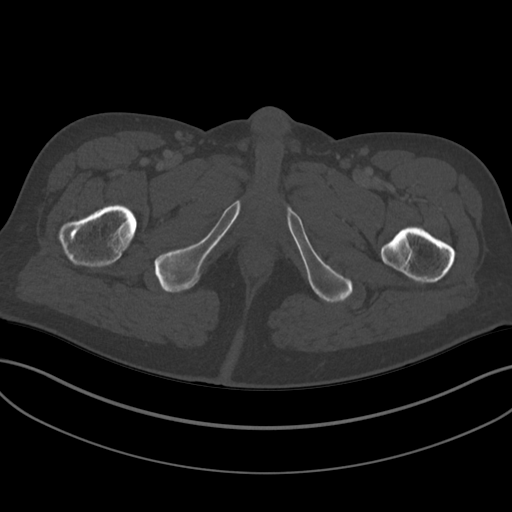
[im 43/269  soft-tissue]
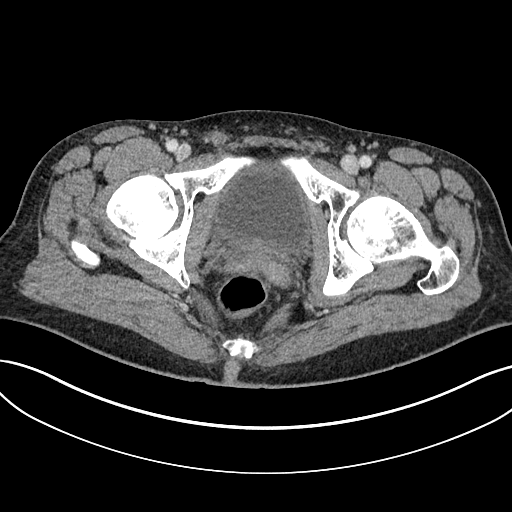
[im 57/269  soft-tissue]
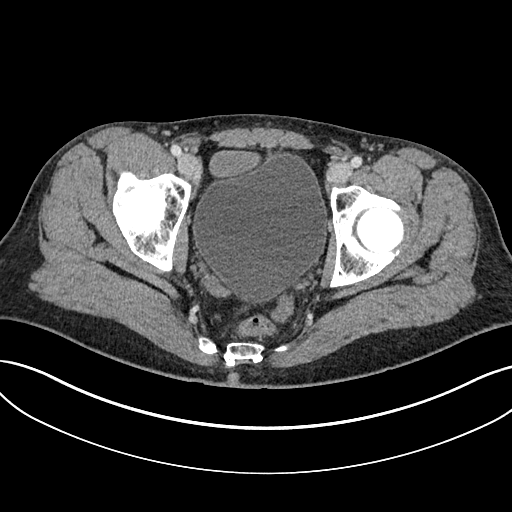
[im 71/269  soft-tissue]
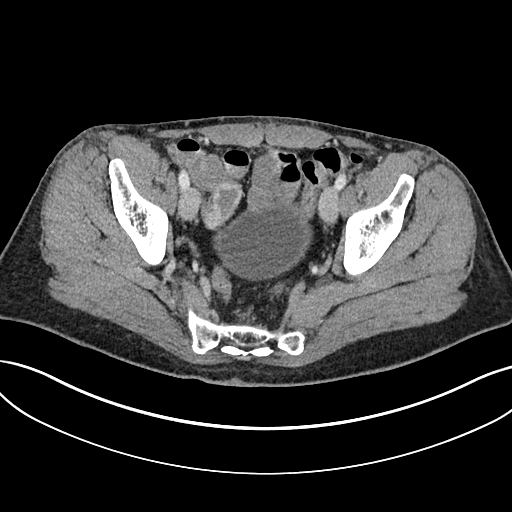
[im 99/269  soft-tissue]
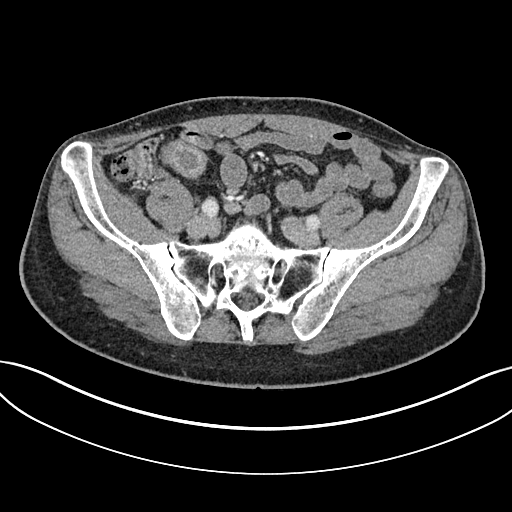
[im 113/269  soft-tissue]
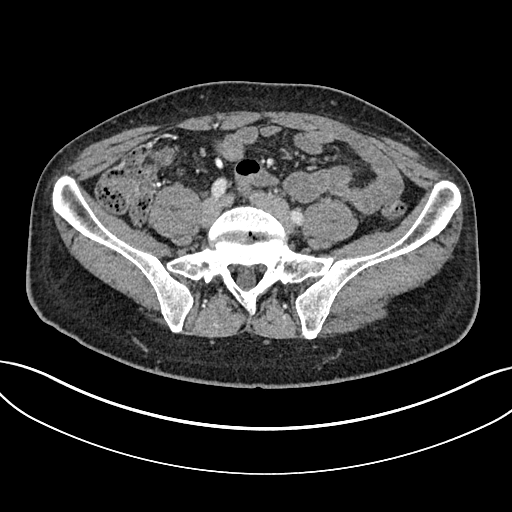
[im 142/269  soft-tissue]
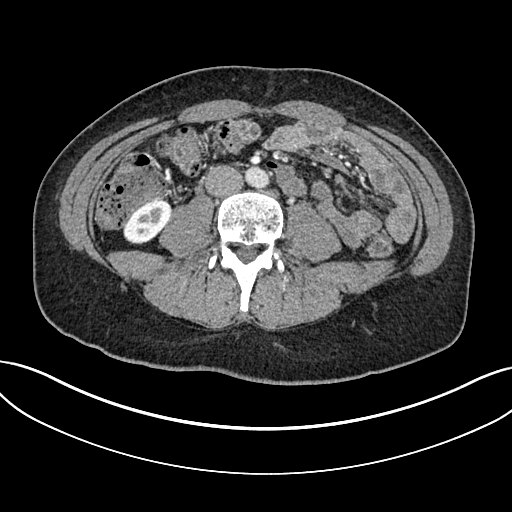
[im 156/269  soft-tissue]
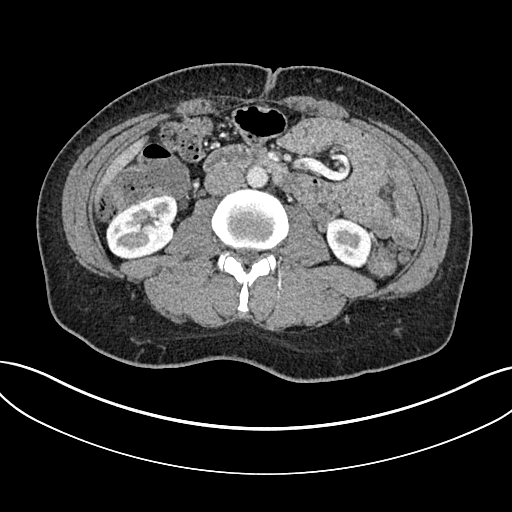
[im 170/269  soft-tissue]
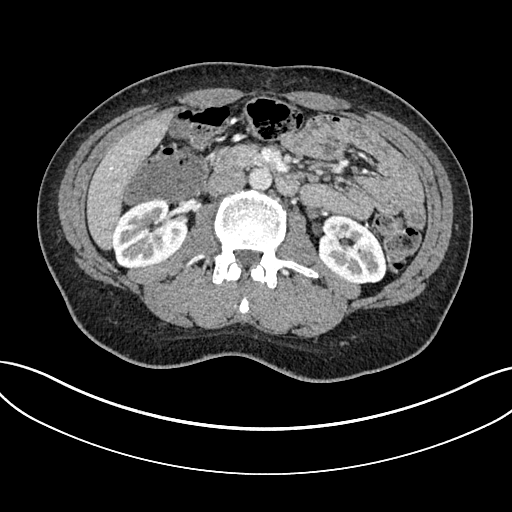
[im 170/269  bone]
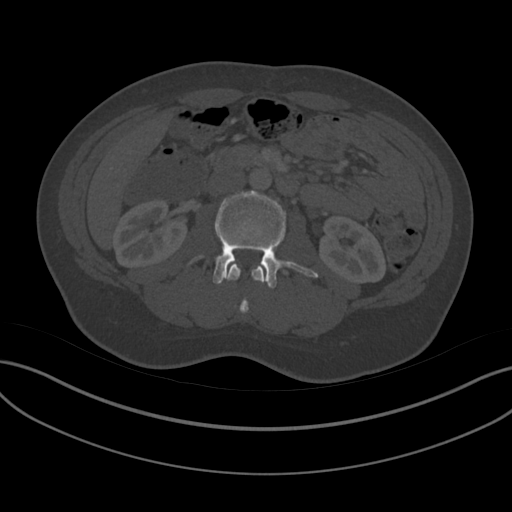
[im 198/269  soft-tissue]
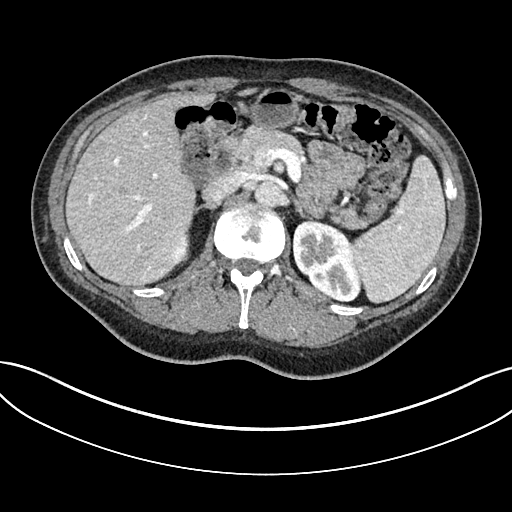
[im 212/269  soft-tissue]
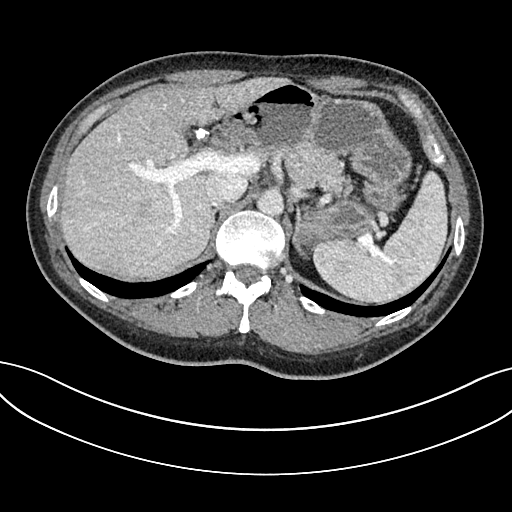
[im 226/269  soft-tissue]
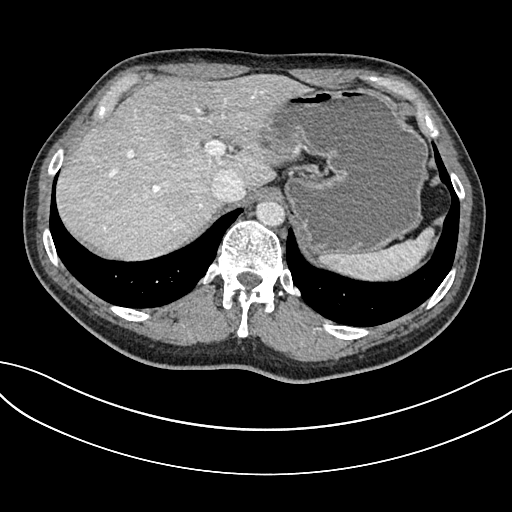
[im 254/269  soft-tissue]
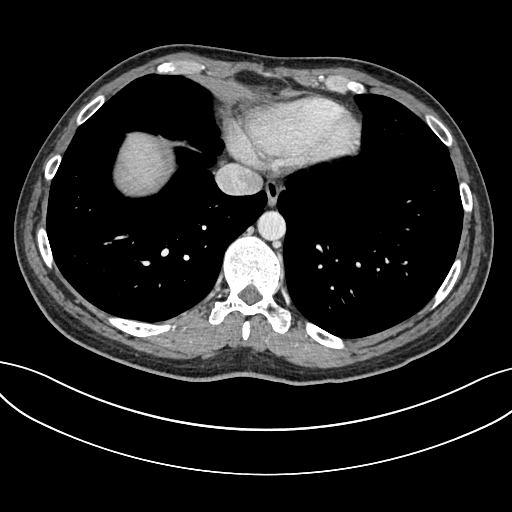

[Series 6: abdomen 3.0 mpr cor · coronal · 0.72mm/px · 3 of 89 slices shown]
[im 30/89  soft-tissue]
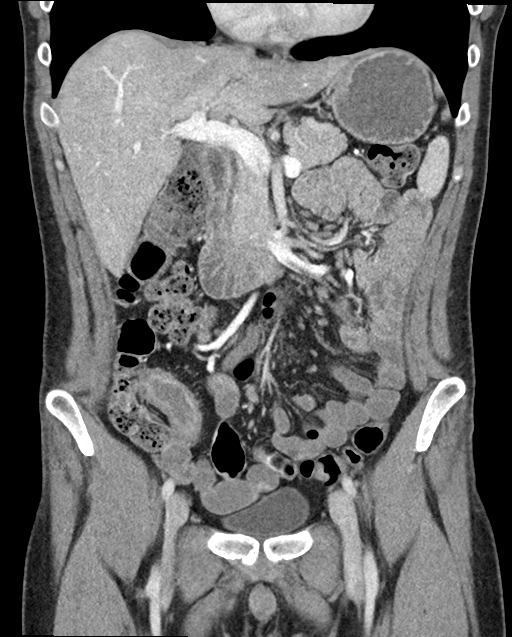
[im 40/89  soft-tissue]
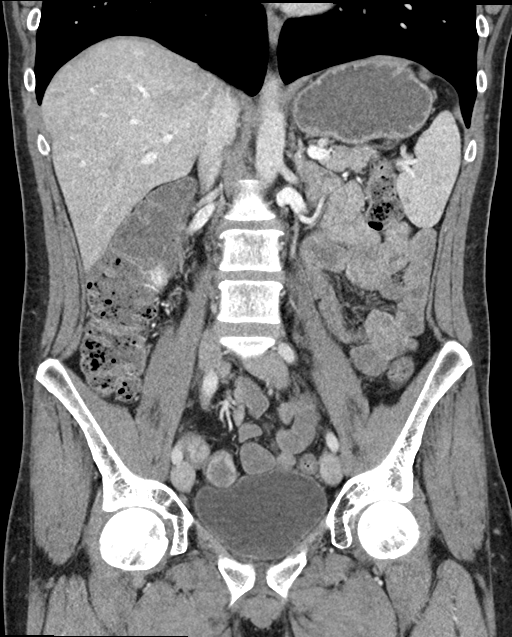
[im 49/89  soft-tissue]
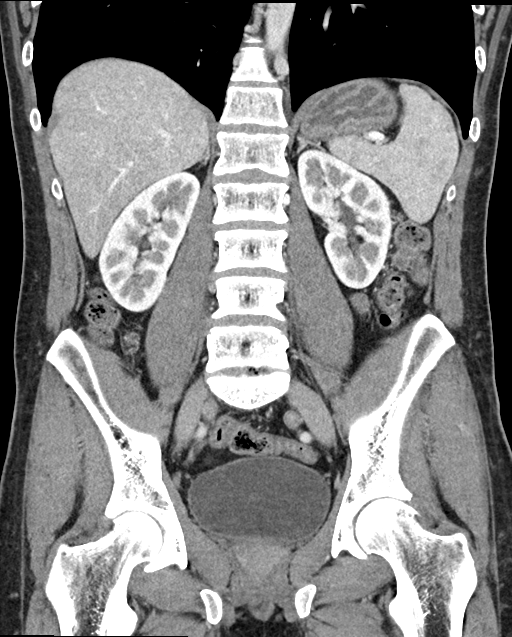

[16 of 46 positions shown; findings below may reference images not displayed]

RADIATION DOSE REDUCTION: This exam was performed according to the
departmental dose-optimization program which includes automated
exposure control, adjustment of the mA and/or kV according to
patient size and/or use of iterative reconstruction technique.

CONTRAST:  100mL OMNIPAQUE IOHEXOL 300 MG/ML  SOLN
FINDINGS: Lower chest:  No acute findings.

Hepatobiliary: No suspicious hepatic lesion. Gallbladder surgically
absent. No biliary ductal dilation.

Pancreas: No pancreatic ductal dilation or evidence of acute
inflammation.

Spleen: No splenomegaly or focal splenic lesion.

Adrenals/Urinary Tract: Bilateral adrenal glands appear normal. No
hydronephrosis. Symmetric bilateral renal enhancement. Urinary
bladder is unremarkable for degree of distension.

Stomach/Bowel: Stomach is unremarkable for degree of distension. No
pathologic dilation of small or large bowel.

Enhancing mucosa and muscularis propria within edematous submucosa
involving 11-12 cm of the distal ileum through the terminal ileum.
No evidence of abscess or fistula formation.

Vascular/Lymphatic: Normal caliber abdominal aorta. No
pathologically enlarged abdominal or pelvic lymph nodes.

Reproductive: No mass or other significant abnormality.

Other: No pneumoperitoneum. No pneumatosis. No portal venous gas. No
significant abdominopelvic free fluid.

Musculoskeletal: No acute osseous abnormality.
IMPRESSION: Enhancing mucosa and muscularis propria within edematous submucosa
involving 11-12 cm of the distal ileum through the terminal ileum,
consistent with infectious or inflammatory enteritis such as Crohn's
disease. No evidence of abscess or fistula formation.

## 2023-04-09 ENCOUNTER — Ambulatory Visit
Admission: EM | Admit: 2023-04-09 | Discharge: 2023-04-09 | Disposition: A | Discharge status: Home / Self Care | Payer: 59 | Attending: Emergency Medicine | Admitting: Emergency Medicine

## 2023-04-09 DIAGNOSIS — U071 COVID-19: Secondary | ICD-10-CM

## 2023-04-09 NOTE — ED Notes (Signed)
Declined Tylenol, said he will take at home.

## 2023-04-09 NOTE — Discharge Instructions (Signed)
Over 19 is a virus and should improve with time, with most viruses take 7 to 10 days before you start to see improvement but will steadily resolve  Blood work will be pending, once I get results I will notify you and send in antiviral medicine for treatment of COVID  Current quarantine is until 24 hours without fever, if no fever may continue activity wearing mask no symptoms go away     You can take Tylenol and/or Ibuprofen as needed for fever reduction and pain relief.   For cough: honey 1/2 to 1 teaspoon (you can dilute the honey in water or another fluid).  You can also use guaifenesin and dextromethorphan for cough. You can use a humidifier for chest congestion and cough.  If you don't have a humidifier, you can sit in the bathroom with the hot shower running.      For sore throat: try warm salt water gargles, cepacol lozenges, throat spray, warm tea or water with lemon/honey, popsicles or ice, or OTC cold relief medicine for throat discomfort.   For congestion: take a daily anti-histamine like Zyrtec, Claritin, and a oral decongestant, such as pseudoephedrine.  You can also use Flonase 1-2 sprays in each nostril daily.   It is important to stay hydrated: drink plenty of fluids (water, gatorade/powerade/pedialyte, juices, or teas) to keep your throat moisturized and help further relieve irritation/discomfort.

## 2023-04-09 NOTE — ED Provider Notes (Signed)
Renaldo Fiddler    CSN: [id number] Arrival date & time: 04/09/23  1858      History   Chief Complaint Chief Complaint  Patient presents with   Fever   Cough   Headache    HPI Mario [name] is a 37 y.o. male.   Patient presents for evaluation of fever, sore throat, left-sided ear fullness, productive cough and nausea without vomiting beginning 2 days ago.  Tested positive for COVID.  No known sick contacts prior.  Decreased appetite but able to tolerate food and liquids.  Has attempted use of DayQuil and NyQuil which have been somewhat helpful.  History of ulcerative colitis, denies history of seizures.  Past Medical History:  Diagnosis Date   Anxiety    GERD (gastroesophageal reflux disease)    OCC   Seizures (HCC)    37 years old X1   Substance abuse (HCC)    as a teenager    Patient Active Problem List   Diagnosis Date Noted   Scrotal pain 05/15/2019   Urinary hesitancy 05/15/2019   S/P laparoscopic cholecystectomy 06/01/2018   Symptomatic cholelithiasis 05/04/2018    Past Surgical History:  Procedure Laterality Date   CHOLECYSTECTOMY N/A 05/17/2018   Procedure: LAPAROSCOPIC CHOLECYSTECTOMY;  Surgeon: Duanne Guess, MD;  Location: [facility] ORS;  Service: General;  Laterality: N/A;   OTHER SURGICAL HISTORY Right 11/2017   right cyst shoulder excision       Home Medications    Prior to Admission medications   Medication Sig Start Date End Date Taking? Authorizing Provider  acetaminophen (TYLENOL) 500 MG tablet Take 500 mg by mouth every 6 (six) hours as needed for moderate pain.    [provider]  calcium carbonate (TUMS - DOSED IN MG ELEMENTAL CALCIUM) 500 MG chewable tablet Chew 1 tablet by mouth as needed for indigestion or heartburn.    [provider]  ibuprofen (ADVIL,MOTRIN) 200 MG tablet Take 800 mg by mouth every 6 (six) hours as needed for headache or mild pain.    [provider]  tamsulosin (FLOMAX) 0.4 MG  CAPS capsule Take 1 capsule (0.4 mg total) by mouth daily. 05/15/19   Riki Altes, MD    Family History Family History  Problem Relation Age of Onset   Cancer Maternal Aunt    Cancer Maternal Grandmother     Social History Social History   Tobacco Use   Smoking status: Former    Current packs/day: 0.00    Average packs/day: 1 pack/day for 10.0 years (10.0 ttl pk-yrs)    Types: Cigarettes    Start date: 05/11/2007    Quit date: 05/10/2017    Years since quitting: 5.9   Smokeless tobacco: Never  Vaping Use   Vaping status: Never Used  Substance Use Topics   Alcohol use: Yes    Comment: WINE OCC   Drug use: Yes    Types: Marijuana    Comment: DAILY     Allergies   Patient has no known allergies.   Review of Systems Review of Systems   Physical Exam Triage Vital Signs ED Triage Vitals  Encounter Vitals Group     BP 04/09/23 1916 111/74     Systolic BP Percentile --      Diastolic BP Percentile --      Pulse Rate 04/09/23 1916 (!) 110     Resp 04/09/23 1916 20     Temp 04/09/23 1916 (!) 102.3 F (39.1 C)     Temp  Source 04/09/23 1916 Oral     SpO2 04/09/23 1916 95 %     Weight --      Height --      Head Circumference --      Peak Flow --      Pain Score 04/09/23 1915 7     Pain Loc --      Pain Education --      Exclude from Growth Chart --    No data found.  Updated Vital Signs BP 111/74 (BP Location: Left Arm)   Pulse (!) 110   Temp (!) 102.3 F (39.1 C) (Oral)   Resp 20   SpO2 95%   Visual Acuity Right Eye Distance:   Left Eye Distance:   Bilateral Distance:    Right Eye Near:   Left Eye Near:    Bilateral Near:     Physical Exam Constitutional:      Appearance: Normal appearance. He is well-developed.  HENT:     Head: Normocephalic.     Right Ear: Tympanic membrane, ear canal and external ear normal.     Left Ear: Tympanic membrane, ear canal and external ear normal.     Nose: Congestion present. No rhinorrhea.      Mouth/Throat:     Mouth: Mucous membranes are moist.     Pharynx: Oropharynx is clear. Posterior oropharyngeal erythema present. No oropharyngeal exudate.  Eyes:     Extraocular Movements: Extraocular movements intact.  Cardiovascular:     Rate and Rhythm: Normal rate and regular rhythm.     Pulses: Normal pulses.     Heart sounds: Normal heart sounds.  Pulmonary:     Effort: Pulmonary effort is normal.     Breath sounds: Normal breath sounds.  Musculoskeletal:        General: Normal range of motion.     Cervical back: Normal range of motion and neck supple.  Skin:    General: Skin is warm and dry.  Neurological:     Mental Status: He is alert and oriented to person, place, and time. Mental status is at baseline.      UC Treatments / Results  Labs (all labs ordered are listed, but only abnormal results are displayed) Labs Reviewed  COMPREHENSIVE METABOLIC PANEL    EKG   Radiology No results found.  Procedures Procedures (including critical care time)  Medications Ordered in UC Medications - No data to display  Initial Impression / Assessment and Plan / UC Course  I have reviewed the triage vital signs and the nursing notes.  Pertinent labs & imaging results that were available during my care of the patient were reviewed by me and considered in my medical decision making (see chart for details).  COVID 19  Patient is in no signs of distress nor toxic appearing.  Fever of 102.3 with associated tachycardia noted in triage, declined treatment.  low suspicion for pneumonia, pneumothorax or bronchitis and therefore will defer imaging.  CMP pending needing GFR to prescribe Paxlovid.  Just quarantine per [employer].May use additional over-the-counter medications as needed for supportive care.  May follow-up with urgent care as needed if symptoms persist or worsen.   Final Clinical Impressions(s) / UC Diagnoses   Final diagnoses:  COVID-19     Discharge Instructions       Over 19 is a virus and should improve with time, with most viruses take 7 to 10 days before you start to see improvement but will steadily resolve  Blood  work will be pending, once I get results I will notify you and send in antiviral medicine for treatment of COVID  Current quarantine is until 24 hours without fever, if no fever may continue activity wearing mask no symptoms go away     You can take Tylenol and/or Ibuprofen as needed for fever reduction and pain relief.   For cough: honey 1/2 to 1 teaspoon (you can dilute the honey in water or another fluid).  You can also use guaifenesin and dextromethorphan for cough. You can use a humidifier for chest congestion and cough.  If you don't have a humidifier, you can sit in the bathroom with the hot shower running.      For sore throat: try warm salt water gargles, cepacol lozenges, throat spray, warm tea or water with lemon/honey, popsicles or ice, or OTC cold relief medicine for throat discomfort.   For congestion: take a daily anti-histamine like Zyrtec, Claritin, and a oral decongestant, such as pseudoephedrine.  You can also use Flonase 1-2 sprays in each nostril daily.   It is important to stay hydrated: drink plenty of fluids (water, gatorade/powerade/pedialyte, juices, or teas) to keep your throat moisturized and help further relieve irritation/discomfort.    ED Prescriptions   None    PDMP not reviewed this encounter.   Valinda Hoar, NP 04/09/23 [phone]

## 2023-04-09 NOTE — ED Triage Notes (Signed)
Patient presents to UC for fever, cough, HA since weds. Tested positive for COVID. Hx of Crohn's. Treating with dayquil, nyquil.

## 2023-04-11 ENCOUNTER — Telehealth: Payer: Self-pay | Admitting: Emergency Medicine

## 2023-04-11 LAB — COMPREHENSIVE METABOLIC PANEL
ALT: 10 [IU]/L (ref 0–44)
AST: 14 [IU]/L (ref 0–40)
Albumin: 4.2 g/dL (ref 4.1–5.1)
Alkaline Phosphatase: 83 [IU]/L (ref 44–121)
BUN/Creatinine Ratio: 7 — ABNORMAL LOW (ref 9–20)
BUN: 8 mg/dL (ref 6–20)
Bilirubin Total: 0.4 mg/dL (ref 0.0–1.2)
CO2: 24 mmol/L (ref 20–29)
Calcium: 9.1 mg/dL (ref 8.7–10.2)
Chloride: 96 mmol/L (ref 96–106)
Creatinine, Ser: 1.08 mg/dL (ref 0.76–1.27)
Globulin, Total: 2.5 g/dL (ref 1.5–4.5)
Glucose: 103 mg/dL — ABNORMAL HIGH (ref 70–99)
Potassium: 4 mmol/L (ref 3.5–5.2)
Sodium: 136 mmol/L (ref 134–144)
Total Protein: 6.7 g/dL (ref 6.0–8.5)
eGFR: 91 mL/min/{1.73_m2} (ref >59)

## 2023-04-11 NOTE — Telephone Encounter (Signed)
CMP resulted this morning to check GFR level, needed to initiate Paxlovid for COVID positive results, at this time symptoms have progressively been improving and patient is no longer experiencing fevers, would like to defer use of antiviral, discussed continued symptomatic management and monitoring, reviewed remaining CMP within normal limits, glucose slightly elevated, unconcerning, BUN/creatinine ratio slightly lowered unconcerning as individual labs are within normal limits, discussed this with patient, stable

## 2023-05-17 DIAGNOSIS — Z302 Encounter for sterilization: Secondary | ICD-10-CM | POA: Diagnosis not present
